# Patient Record
Sex: Female | Born: 1935 | Race: Asian | Hispanic: No | Smoking: Never smoker
Health system: Southern US, Community
[De-identification: ages and names within clinical notes are randomized; demographics above are authoritative.]

## PROBLEM LIST (undated history)

## (undated) DIAGNOSIS — I10 Essential (primary) hypertension: Secondary | ICD-10-CM

## (undated) DIAGNOSIS — M1990 Unspecified osteoarthritis, unspecified site: Secondary | ICD-10-CM

## (undated) DIAGNOSIS — I6529 Occlusion and stenosis of unspecified carotid artery: Secondary | ICD-10-CM

## (undated) DIAGNOSIS — Z8543 Personal history of malignant neoplasm of ovary: Secondary | ICD-10-CM

## (undated) DIAGNOSIS — E119 Type 2 diabetes mellitus without complications: Secondary | ICD-10-CM

## (undated) DIAGNOSIS — E785 Hyperlipidemia, unspecified: Secondary | ICD-10-CM

## (undated) HISTORY — PX: CATARACT EXTRACTION: SUR2

## (undated) HISTORY — DX: Type 2 diabetes mellitus without complications: E11.9

## (undated) HISTORY — PX: ABDOMINAL HYSTERECTOMY: SHX81

## (undated) HISTORY — PX: APPENDECTOMY: SHX54

## (undated) HISTORY — DX: Essential (primary) hypertension: I10

## (undated) HISTORY — DX: Unspecified osteoarthritis, unspecified site: M19.90

## (undated) HISTORY — DX: Hyperlipidemia, unspecified: E78.5

## (undated) HISTORY — DX: Personal history of malignant neoplasm of ovary: Z85.43

## (undated) HISTORY — DX: Occlusion and stenosis of unspecified carotid artery: I65.29

---

## 2013-07-16 DIAGNOSIS — M25561 Pain in right knee: Secondary | ICD-10-CM | POA: Insufficient documentation

## 2013-07-16 DIAGNOSIS — M25562 Pain in left knee: Secondary | ICD-10-CM | POA: Insufficient documentation

## 2013-07-16 DIAGNOSIS — M1710 Unilateral primary osteoarthritis, unspecified knee: Secondary | ICD-10-CM | POA: Insufficient documentation

## 2014-01-15 DIAGNOSIS — E559 Vitamin D deficiency, unspecified: Secondary | ICD-10-CM | POA: Diagnosis not present

## 2014-01-15 DIAGNOSIS — E785 Hyperlipidemia, unspecified: Secondary | ICD-10-CM | POA: Diagnosis not present

## 2014-01-15 DIAGNOSIS — M810 Age-related osteoporosis without current pathological fracture: Secondary | ICD-10-CM | POA: Diagnosis not present

## 2014-01-15 DIAGNOSIS — IMO0001 Reserved for inherently not codable concepts without codable children: Secondary | ICD-10-CM | POA: Diagnosis not present

## 2014-01-15 DIAGNOSIS — I10 Essential (primary) hypertension: Secondary | ICD-10-CM | POA: Diagnosis not present

## 2014-01-29 DIAGNOSIS — H26499 Other secondary cataract, unspecified eye: Secondary | ICD-10-CM | POA: Diagnosis not present

## 2015-07-02 ENCOUNTER — Ambulatory Visit (INDEPENDENT_AMBULATORY_CARE_PROVIDER_SITE_OTHER): Payer: Medicaid Other | Admitting: Family Medicine

## 2015-07-02 DIAGNOSIS — Z5321 Procedure and treatment not carried out due to patient leaving prior to being seen by health care provider: Secondary | ICD-10-CM

## 2015-07-02 NOTE — Progress Notes (Signed)
Patient was scheduled for an appointment today but there was no interperter. She was rescheduled for another day.

## 2015-07-02 NOTE — Patient Instructions (Signed)
Will reschedule for another day with an interpreter.

## 2015-07-06 ENCOUNTER — Ambulatory Visit (INDEPENDENT_AMBULATORY_CARE_PROVIDER_SITE_OTHER): Payer: Medicaid Other | Admitting: Family Medicine

## 2015-07-06 ENCOUNTER — Encounter: Payer: Self-pay | Admitting: Cardiovascular Disease

## 2015-07-06 ENCOUNTER — Encounter: Payer: Self-pay | Admitting: Family Medicine

## 2015-07-06 ENCOUNTER — Ambulatory Visit (INDEPENDENT_AMBULATORY_CARE_PROVIDER_SITE_OTHER): Payer: Medicare Other | Admitting: Cardiovascular Disease

## 2015-07-06 VITALS — BP 149/75 | HR 92 | Ht 59.0 in | Wt 132.0 lb

## 2015-07-06 VITALS — BP 120/62 | HR 92 | Ht 59.0 in | Wt 135.5 lb

## 2015-07-06 DIAGNOSIS — I10 Essential (primary) hypertension: Secondary | ICD-10-CM

## 2015-07-06 DIAGNOSIS — E1165 Type 2 diabetes mellitus with hyperglycemia: Secondary | ICD-10-CM

## 2015-07-06 DIAGNOSIS — N644 Mastodynia: Secondary | ICD-10-CM | POA: Diagnosis not present

## 2015-07-06 DIAGNOSIS — R079 Chest pain, unspecified: Secondary | ICD-10-CM

## 2015-07-06 DIAGNOSIS — R0789 Other chest pain: Secondary | ICD-10-CM

## 2015-07-06 DIAGNOSIS — R269 Unspecified abnormalities of gait and mobility: Secondary | ICD-10-CM | POA: Diagnosis not present

## 2015-07-06 DIAGNOSIS — R1012 Left upper quadrant pain: Secondary | ICD-10-CM

## 2015-07-06 DIAGNOSIS — Z780 Asymptomatic menopausal state: Secondary | ICD-10-CM

## 2015-07-06 DIAGNOSIS — L404 Guttate psoriasis: Secondary | ICD-10-CM | POA: Diagnosis not present

## 2015-07-06 DIAGNOSIS — Z5181 Encounter for therapeutic drug level monitoring: Secondary | ICD-10-CM | POA: Diagnosis not present

## 2015-07-06 DIAGNOSIS — R109 Unspecified abdominal pain: Secondary | ICD-10-CM

## 2015-07-06 DIAGNOSIS — M129 Arthropathy, unspecified: Secondary | ICD-10-CM

## 2015-07-06 DIAGNOSIS — G8929 Other chronic pain: Secondary | ICD-10-CM | POA: Diagnosis not present

## 2015-07-06 DIAGNOSIS — R10A2 Flank pain, left side: Secondary | ICD-10-CM

## 2015-07-06 DIAGNOSIS — E785 Hyperlipidemia, unspecified: Secondary | ICD-10-CM

## 2015-07-06 DIAGNOSIS — M170 Bilateral primary osteoarthritis of knee: Secondary | ICD-10-CM

## 2015-07-06 LAB — UA/M W/RFLX CULTURE, ROUTINE
BILIRUBIN UA: NEGATIVE
KETONES UA: NEGATIVE
LEUKOCYTES UA: NEGATIVE
NITRITE UA: NEGATIVE
Protein, UA: NEGATIVE
SPEC GRAV UA: 1.01 (ref 1.005–1.030)
Urobilinogen, Ur: 0.2 mg/dL (ref 0.2–1.0)
pH, UA: 7 (ref 5.0–7.5)

## 2015-07-06 LAB — MICROSCOPIC EXAMINATION

## 2015-07-06 MED ORDER — PANTOPRAZOLE SODIUM 40 MG PO TBEC
40.0000 mg | DELAYED_RELEASE_TABLET | Freq: Every day | ORAL | Status: DC
Start: 2015-07-06 — End: 2015-09-21

## 2015-07-06 MED ORDER — ASPIRIN EC 81 MG PO TBEC: 81.0000 mg | DELAYED_RELEASE_TABLET | Freq: Every day | ORAL | Status: DC

## 2015-07-06 NOTE — Assessment & Plan Note (Signed)
Check A1C and urine microalbumin; foot exam today by MD; continue meds; check creatinine to make sure the metformin is appropriate

## 2015-07-06 NOTE — Assessment & Plan Note (Signed)
Check urine today to r/o hematuria; stool cards given

## 2015-07-06 NOTE — Assessment & Plan Note (Signed)
Check fasting lipids and sgpt; limit saturated fats

## 2015-07-06 NOTE — Patient Instructions (Addendum)
I have entered in an order for a bone density study to check for osteoporosis I have entered a referral for you to see a heart doctor to check for blockages in your heart that might cause your left-sided chest pain; they will see you at 2:30 pm today If you develop significant pain in your chest, call 911 Do not do anything to exert yourself or cause stress until you get checked out by the heart doctor Start aspirin 81 mg daily, but make sure it is coated to protect your stomach; start that today If you develop abdominal pain, heartburn, acid reflux, or dark stools, then stop taking the aspirin and call the doctor Try turmeric as a natural anti-inflammatory (for pain and arthritis). It comes in capsules where you buy aspirin and fish oil, but also as a spice where you buy pepper and garlic powder. If you need something for aches or pains, try to use Tylenol (acetaminphen) instead of non-steroidals (which include Aleve, ibuprofen, Advil, Motrin, and naproxen); non-steroidals can cause long-term kidney damage Please return the stool cards at your convenience before your appointment, or you can bring them with you in two weeks Try to limit saturated fats (like bacon, sausage, hamburgers, fried foods, cheese) and try to eat no more than three eggs per week Try to eat plenty of fruits and vegetables Check your feet every night for sores, and let me know if any develop We recommend that you see an eye doctor every year for your diabetes Return in two weeks for follow-up and we'll make sure we have addressed all of your concerns Request your records from last doctor

## 2015-07-06 NOTE — Patient Instructions (Addendum)
Medication Instructions:  Your physician has recommended you make the following change in your medication:  START taking protonix 40mg  once per day   Labwork: none  Testing/Procedures: Your physician has requested that you have a lexiscan myoview. For further information please visit [email or web address] Please follow instruction sheet, as given.  [facility] MYOVIEW  Your caregiver has ordered a Stress Test with nuclear imaging. The purpose of this test is to evaluate the blood supply to your heart muscle. This procedure is referred to as a "Non-Invasive Stress Test." This is because other than having an IV started in your vein, nothing is inserted or "invades" your body. Cardiac stress tests are done to find areas of poor blood flow to the heart by determining the extent of coronary artery disease (CAD). Some patients exercise on a treadmill, which naturally increases the blood flow to your heart, while others who are  unable to walk on a treadmill due to physical limitations have a pharmacologic/chemical stress agent called Lexiscan . This medicine will mimic walking on a treadmill by temporarily increasing your coronary blood flow.   Please note: these test may take anywhere between 2-4 hours to complete  PLEASE REPORT TO [facility] MEDICAL MALL ENTRANCE  THE VOLUNTEERS AT THE FIRST DESK WILL DIRECT YOU WHERE TO GO  Date of Procedure: September 1, 8:00am Arrival Time for Procedure: 7:30am  Instructions regarding medication:   __xx__ : Hold metformin 24 hours before and 48 hours after procedure  ____:  Hold betablocker(s) night before procedure and morning of procedure  _xx___:  Hold other medications as follows:_losartan/HCTZ the morning of procedure  PLEASE NOTIFY THE OFFICE AT LEAST 24 HOURS IN ADVANCE IF YOU ARE UNABLE TO KEEP YOUR APPOINTMENT.  [phone] AND  PLEASE NOTIFY NUCLEAR MEDICINE AT [facility] AT LEAST 24 HOURS IN ADVANCE IF YOU ARE UNABLE TO KEEP YOUR APPOINTMENT.  [phone]  How to prepare for your Myoview test:   Do not eat or drink after midnight  No caffeine for 24 hours prior to test  No smoking 24 hours prior to test.  Your medication may be taken with water.  If your doctor stopped a medication because of this test, do not take that medication.  Ladies, please do not wear dresses.  Skirts or pants are appropriate. Please wear a short sleeve shirt.  No perfume, cologne or lotion.  Wear comfortable walking shoes. No heels!            Follow-Up: Your physician recommends that you schedule a follow-up appointment with Dr. Kirke Corin as needed.    Any Other Special Instructions Will Be Listed Below (If Applicable).  Nuclear Medicine Exam A nuclear medicine exam is a safe and painless imaging test. It helps to detect and diagnose disease in the body as well as provide information about organ function and structure.  Nuclear scans are most often done of the:  Lungs.  Heart.  Thyroid gland.  Bones.  Abdomen. HOW A NUCLEAR MEDICINE EXAM WORKS A nuclear medicine exam works by using a radioactive tracer. The material is given either by an IV (intravenous) injection or it may be swallowed. After the tracer is in the body, it is absorbed by your body's organs. A large scanning machine that uses a special camera detects the radioactivity in your body. A computerized image is then formed regarding the area of concern. The small amounts of radioactive material used in a nuclear medicine exam are found to be medically safe. However, because radioactive material is used, this  test is not done if you are pregnant or nursing.  BEFORE THE PROCEDURE  If available, bring previous imaging studies such as x-rays, etc. with you to the exam.  Arrive early for your exam. PROCEDURE  An IV may be started before the exam begins.  Depending on the type of examination, will lie on a table or sit in a chair during the exam.  The nuclear medicine  exam will take about 30 to 60 minutes to complete. AFTER THE PROCEDURE  After your scan is completed, the image(s) will be evaluated by a specialist. It is important that you follow up with your caregiver to find out your test results.  You may return to your regular activity as instructed by your caregiver. SEEK IMMEDIATE MEDICAL CARE IF: You have shortness of breath or difficulty breathing. MAKE SURE YOU:   Understand these instructions.  Will watch your condition.  Will get help right away if you are not doing well or get worse. Document Released: 11/30/2004 Document Revised: 01/15/2012 Document Reviewed: 01/14/2009 [facility] Patient Information 2015 [place], [place]. This information is not intended to replace advice given to you by your health care provider. Make sure you discuss any questions you have with your health care provider.

## 2015-07-06 NOTE — Assessment & Plan Note (Signed)
Fair control; continue current meds

## 2015-07-06 NOTE — Assessment & Plan Note (Signed)
Patient has single-prong cane; she does not want four-prong cane or walker; she would like scooter or motorized wheelchair; family member will contact a company of their choice and send paperwork, and we'll do face-to-face for that when paperwork comes in

## 2015-07-06 NOTE — Progress Notes (Signed)
Primary care physician: Dr. Sherie Don.  HPI  This is a pleasant 81 year old female who was referred for evaluation of chest pain and an abnormal EKG. She is originally from [place] and does not speak any [place]. An interpreter is present with her. 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 She has no previous cardiac history. She has multiple chronic medical conditions that include type 2 diabetes, hypertension and hyperlipidemia. She reports left-sided chest pain under the left breast radiating to her back. This usually gets worse with touch and certain movements. She describes another content of chest discomfort substernally that radiates to her throat. This gets worse with spicy food. She describes coughing spells when she lies down. She walks with a cane normally. She describes recent symptoms of exertional dyspnea. No orthopnea or PND. She is not a smoker and has no family history of coronary artery disease.   No Known Allergies   Current Outpatient Prescriptions on File Prior to Visit  Medication Sig Dispense Refill  . Empagliflozin-Linagliptin (GLYXAMBI) 25-5 MG TABS Take 1 tablet by mouth daily.    Marland Kitchen losartan-hydrochlorothiazide (HYZAAR) 100-25 MG per tablet Take 1 tablet by mouth daily.    . metFORMIN (GLUCOPHAGE-XR) 500 MG 24 hr tablet Take 500 mg by mouth 2 (two) times daily.    . pravastatin (PRAVACHOL) 20 MG tablet Take 20 mg by mouth at bedtime.     No current facility-administered medications on file prior to visit.     Past Medical History  Diagnosis Date  . Hyperlipidemia   . Hypertension   . Diabetes mellitus without complication   . History of ovarian cancer   . DJD (degenerative joint disease)      Past Surgical History  Procedure Laterality Date  . Appendectomy    . Abdominal hysterectomy      partial, due to ovarian cancer  . Cataract extraction       Family History  Problem Relation Age of Onset  . Family history unknown: Yes     Social History    Social History  . Marital Status: Unknown    Spouse Name: N/A  . Number of Children: N/A  . Years of Education: N/A   Occupational History  . Not on file.   Social History Main Topics  . Smoking status: Never Smoker   . Smokeless tobacco: Never Used  . Alcohol Use: No  . Drug Use: No  . Sexual Activity: Not on file   Other Topics Concern  . Not on file   Social History Narrative     ROS A 10 point review of system was performed. It is negative other than that mentioned in the history of present illness.   PHYSICAL EXAM   BP 120/62 mmHg  Pulse 92  Ht  (1.499 m)  Wt 135 lb 8 oz (61.462 kg)  BMI 27.35 kg/m2 Constitutional: She is oriented to person, place, and time. She appears well-developed and well-nourished. No distress.  HENT: No nasal discharge.  Head: Normocephalic and atraumatic.  Eyes: Pupils are equal and round. No discharge.  Neck: Normal range of motion. Neck supple. No JVD present. No thyromegaly present.  Cardiovascular: Normal rate, regular rhythm, normal heart sounds. Exam reveals no gallop and no friction rub. No murmur heard.  Pulmonary/Chest: Effort normal and breath sounds normal. No stridor. No respiratory distress. She has no wheezes. She has no rales. She exhibits no tenderness.  Abdominal: Soft. Bowel sounds are normal. She exhibits no distension. There is  no tenderness. There is no rebound and no guarding.  Musculoskeletal: Normal range of motion. She exhibits no edema and no tenderness.  Neurological: She is alert and oriented to person, place, and time. Coordination normal.  Skin: Skin is warm and dry. No rash noted. She is not diaphoretic. No erythema. No pallor.  Psychiatric: She has a normal mood and affect. Her behavior is normal. Judgment and thought content normal.     ZOX:[id number]  Rhythm  WITHIN NORMAL LIMITS    ASSESSMENT AND PLAN

## 2015-07-06 NOTE — Assessment & Plan Note (Signed)
Will try tylenol and turmeric; deferred xrays today since she has other pressing problems

## 2015-07-06 NOTE — Assessment & Plan Note (Signed)
The left sided chest pain under the left breast seems to be musculoskeletal in etiology. She also has other symptoms that are highly suggestive of GERD. Thus, I prescribed Protonix 40 mg once daily. She describes significant exertional dyspnea over the last few months and has prolonged history of diabetes and other risk factors. EKG from earlier today was abnormal. However, repeat in our office is normal. I recommend evaluation with a pharmacologic nuclear stress test.

## 2015-07-06 NOTE — Assessment & Plan Note (Signed)
Blood pressure is controlled on current medications. 

## 2015-07-06 NOTE — Progress Notes (Signed)
BP 149/75 mmHg  Pulse 92  Ht  (1.499 m)  Wt 132 lb (59.875 kg)  BMI 26.65 kg/m2  SpO2 96%   Subjective:    Patient ID: Tasha Rivera, female    DOB: [birth year], 81 y.o.   MRN: [id number]  HPI: Tasha Rivera is a 81 y.o. female  Chief Complaint  Patient presents with  . Establish Care   Patient does not speak any English; she is accompanied by the [facility] interpreter High blood pressure; on medicines; had high BP for about 10 years; she has had some cough; no leg swelling  Coughing up phlegm, less than a month; no fever; no shortness of breath; irritating over the left side of the chest from time to time, under the left breast; she had a lung problem when she was young; many years ago; worse with spicy or salty food; thinks maybe side effect; no fam hx of heart attack; the pain lasts about short time, then gone; lasts just a minute; rubs and it goes away; no nausea; no pain to the shoulder; actually comes from left flank; sharp; blood when wiping, but not in the bowel; no blood in the urine  Blackey on her head; about 6 years ago, fell on left side of head, not sure why she brought this up; does not bother her much or every day; lost some hearing, wearing hearing aide  Arthritis in her knees and hands  Psoriasis on arms and legs; has a cream for that; it helps the rash; they forgot to bring that today, don't know the name of it  Diabetes; has had diabetes about 20 years; check sugars, 260 this morning before breakfast; last blood draw was 5 months; dry mouth; no blurred vision; had eye surgery a while ago, cataract surgery and then eyes have been weak; nocturia 3-4 times; no problems with diabetes medicine  High cholesterol; last labs 5 months ago; diagnosed 20 years ago  Wants to make sure she doesn't have osteoporosis; would like a bone test  She is a Education officer, environmental  Relevant past medical, surgical, family and social history reviewed and updated as indicated. Interim medical  history since our last visit reviewed. Allergies and medications reviewed and updated.  Review of Systems Per HPI unless specifically indicated above     Objective:    BP 149/75 mmHg  Pulse 92  Ht  (1.499 m)  Wt 132 lb (59.875 kg)  BMI 26.65 kg/m2  SpO2 96%  Wt Readings from Last 3 Encounters:  07/06/15 132 lb (59.875 kg)    Physical Exam  Constitutional: She appears well-developed and well-nourished. No distress.  HENT:  Head: Normocephalic and atraumatic.  Eyes: EOM are normal. No scleral icterus.  Neck: No thyromegaly present.  Cardiovascular: Normal rate, regular rhythm and normal heart sounds.   No murmur heard. Pulmonary/Chest: Effort normal and breath sounds normal. No respiratory distress. She has no wheezes.  Abdominal: Soft. Bowel sounds are normal. She exhibits no distension. There is no tenderness. There is no guarding.  Musculoskeletal: Normal range of motion. She exhibits no edema.  Neurological: She is alert. She exhibits normal muscle tone.  Skin: Skin is warm and dry. Rash (erythematous guttate lesions on the extremities, mild erythema with whitish scale) noted. No bruising, no ecchymosis and no purpura noted. She is not diaphoretic. No cyanosis. No pallor. Nails show no clubbing.  Psychiatric: She has a normal mood and affect. Her behavior is normal. Judgment and thought content normal.  Very  pleasant, good eye contact with examiner   Diabetic Foot Form - Detailed   Diabetic Foot Exam - detailed  Diabetic Foot exam was performed with the following findings:  Yes 07/06/2015 12:15 PM  Visual Foot Exam completed.:  Yes  Are the toenails long?:  No  Are the toenails thick?:  No  Are the toenails ingrown?:  No    Pulse Foot Exam completed.:  Yes  Right Dorsalis Pedis:  Present Left Dorsalis Pedis:  Present  Sensory Foot Exam Completed.:  Yes  Semmes-Weinstein Monofilament Test  R Site 1-Great Toe:  Pos L Site 1-Great Toe:  Pos  R Site 4:  Pos L Site 4:   Pos  R Site 5:  Pos L Site 5:  Pos       No results found for this or any previous visit.    Assessment & Plan:   Problem List Items Addressed This Visit      Cardiovascular and Mediastinum   Essential hypertension, benign    Fair control; continue current meds      Relevant Medications   pravastatin (PRAVACHOL) 20 MG tablet   losartan-hydrochlorothiazide (HYZAAR) 100-25 MG per tablet   aspirin EC 81 MG tablet     Endocrine   Type 2 diabetes mellitus with hyperglycemia - Primary    Check A1C and urine microalbumin; foot exam today by MD; continue meds; check creatinine to make sure the metformin is appropriate      Relevant Medications   pravastatin (PRAVACHOL) 20 MG tablet   metFORMIN (GLUCOPHAGE-XR) 500 MG 24 hr tablet   losartan-hydrochlorothiazide (HYZAAR) 100-25 MG per tablet   Empagliflozin-Linagliptin (GLYXAMBI) 25-5 MG TABS   aspirin EC 81 MG tablet   Other Relevant Orders   Microalbumin / creatinine urine ratio   Hgb A1c w/o eAG     Musculoskeletal and Integument   Arthritis of both knees    Will try tylenol and turmeric; deferred xrays today since she has other pressing problems      Relevant Medications   aspirin EC 81 MG tablet   Psoriasis, guttate     Other   Pain of left breast   Dyslipidemia    Check fasting lipids and sgpt; limit saturated fats      Relevant Medications   pravastatin (PRAVACHOL) 20 MG tablet   Other Relevant Orders   Lipid Panel w/o Chol/HDL Ratio   Medication monitoring encounter   Relevant Orders   CBC with Differential/Platelet   Comprehensive metabolic panel   Chronic left flank pain    Check urine today to r/o hematuria; stool cards given      Relevant Orders   CBC with Differential/Platelet   UA/M w/rflx Culture, Routine   Post-menopausal   Relevant Orders   DG Bone Density   Other chest pain    With EKG changes; refer to cardiologist; she has multiple cardiac risk factors; start aspirin, see AVS       Relevant Orders   EKG 12-Lead (Completed)   Ambulatory referral to Cardiology   Gait abnormality    Patient has single-prong cane; she does not want four-prong cane or walker; she would like scooter or motorized wheelchair; family member will contact a company of their choice and send paperwork, and we'll do face-to-face for that when paperwork comes in         Follow up plan: Return in about 2 weeks (around 07/20/2015) for multiple issues, 45 minutes.  An after-visit summary was printed and given to  the patient at check-out.  Please see the patient instructions which may contain other information and recommendations beyond what is mentioned above in the assessment and plan.  Orders Placed This Encounter  Procedures  . DG Bone Density  . CBC with Differential/Platelet  . Lipid Panel w/o Chol/HDL Ratio  . Microalbumin / creatinine urine ratio  . Comprehensive metabolic panel  . UA/M w/rflx Culture, Routine  . Hgb A1c w/o eAG  . Ambulatory referral to Cardiology  . EKG 12-Lead   Meds ordered this encounter  Medications  . pravastatin (PRAVACHOL) 20 MG tablet    Sig: Take 20 mg by mouth at bedtime.  . metFORMIN (GLUCOPHAGE-XR) 500 MG 24 hr tablet    Sig: Take 500 mg by mouth 2 (two) times daily.  Marland Kitchen losartan-hydrochlorothiazide (HYZAAR) 100-25 MG per tablet    Sig: Take 1 tablet by mouth daily.  . Empagliflozin-Linagliptin (GLYXAMBI) 25-5 MG TABS    Sig: Take 1 tablet by mouth daily.  Marland Kitchen aspirin EC 81 MG tablet    Sig: Take 1 tablet (81 mg total) by mouth daily.    Face-to-face time with patient was more than 50 minutes, >50% time spent counseling and coordination of care

## 2015-07-06 NOTE — Assessment & Plan Note (Signed)
With EKG changes; refer to cardiologist; she has multiple cardiac risk factors; start aspirin, see AVS

## 2015-07-07 ENCOUNTER — Telehealth: Payer: Self-pay

## 2015-07-07 LAB — LIPID PANEL W/O CHOL/HDL RATIO
Cholesterol, Total: 162 mg/dL (ref 100–199)
HDL: 39 mg/dL — AB (ref >39)
LDL Calculated: 49 mg/dL (ref 0–99)
Triglycerides: 368 mg/dL — ABNORMAL HIGH (ref 0–149)
VLDL CHOLESTEROL CAL: 74 mg/dL — AB (ref 5–40)

## 2015-07-07 LAB — CBC WITH DIFFERENTIAL/PLATELET
BASOS: 0 %
Basophils Absolute: 0 10*3/uL (ref 0.0–0.2)
EOS (ABSOLUTE): 0.2 10*3/uL (ref 0.0–0.4)
EOS: 2 %
HEMATOCRIT: 39 % (ref 34.0–46.6)
Hemoglobin: 12.8 g/dL (ref 11.1–15.9)
Immature Grans (Abs): 0 10*3/uL (ref 0.0–0.1)
Immature Granulocytes: 0 %
LYMPHS ABS: 3.1 10*3/uL (ref 0.7–3.1)
Lymphs: 33 %
MCH: 29.3 pg (ref 26.6–33.0)
MCHC: 32.8 g/dL (ref 31.5–35.7)
MCV: 89 fL (ref 79–97)
MONOS ABS: 0.8 10*3/uL (ref 0.1–0.9)
Monocytes: 8 %
NEUTROS ABS: 5.3 10*3/uL (ref 1.4–7.0)
Neutrophils: 57 %
PLATELETS: 236 10*3/uL (ref 150–379)
RBC: 4.37 x10E6/uL (ref 3.77–5.28)
RDW: 12.5 % (ref 12.3–15.4)
WBC: 9.5 10*3/uL (ref 3.4–10.8)

## 2015-07-07 LAB — MICROALBUMIN / CREATININE URINE RATIO
CREATININE, UR: 14.3 mg/dL
MICROALB/CREAT RATIO: 96.5 mg/g{creat} — AB (ref 0.0–30.0)
MICROALBUM., U, RANDOM: 13.8 ug/mL

## 2015-07-07 LAB — COMPREHENSIVE METABOLIC PANEL
A/G RATIO: 1.3 (ref 1.1–2.5)
ALK PHOS: 78 IU/L (ref 39–117)
ALT: 32 IU/L (ref 0–32)
AST: 32 IU/L (ref 0–40)
Albumin: 4.3 g/dL (ref 3.5–4.7)
BUN/Creatinine Ratio: 37 — ABNORMAL HIGH (ref 11–26)
BUN: 31 mg/dL — ABNORMAL HIGH (ref 8–27)
Bilirubin Total: 0.3 mg/dL (ref 0.0–1.2)
CALCIUM: 10.3 mg/dL (ref 8.7–10.3)
CO2: 26 mmol/L (ref 18–29)
Chloride: 89 mmol/L — ABNORMAL LOW (ref 97–108)
Creatinine, Ser: 0.84 mg/dL (ref 0.57–1.00)
GFR calc Af Amer: 71 mL/min/{1.73_m2} (ref >59)
GFR, EST NON AFRICAN AMERICAN: 62 mL/min/{1.73_m2} (ref >59)
GLOBULIN, TOTAL: 3.2 g/dL (ref 1.5–4.5)
Glucose: 271 mg/dL — ABNORMAL HIGH (ref 65–99)
POTASSIUM: 4.7 mmol/L (ref 3.5–5.2)
SODIUM: 132 mmol/L — AB (ref 134–144)
Total Protein: 7.5 g/dL (ref 6.0–8.5)

## 2015-07-07 LAB — HGB A1C W/O EAG: HEMOGLOBIN A1C: 10.6 % — AB (ref 4.8–5.6)

## 2015-07-07 NOTE — Telephone Encounter (Signed)
Interpretor called to check on coverage for medicaid to find out what insurance covers.  Given number to billing department for [facility] and also asked to call medicaid sw .

## 2015-07-08 ENCOUNTER — Ambulatory Visit
Admission: RE | Admit: 2015-07-08 | Discharge: 2015-07-08 | Disposition: A | Discharge status: Home / Self Care | Payer: Medicare Other | Source: Ambulatory Visit | Attending: Cardiovascular Disease | Admitting: Cardiovascular Disease

## 2015-07-08 DIAGNOSIS — R079 Chest pain, unspecified: Secondary | ICD-10-CM | POA: Insufficient documentation

## 2015-07-08 LAB — NM MYOCAR MULTI W/SPECT W/WALL MOTION / EF
CHL CUP NUCLEAR SSS: 4
CSEPHR: 84 %
CSEPPHR: 111 {beats}/min
LV dias vol: 23 mL
LVSYSVOL: 7 mL
NUC STRESS TID: 1
Rest HR: 84 {beats}/min
SDS: 1
SRS: 3

## 2015-07-08 MED ORDER — REGADENOSON 0.4 MG/5ML IV SOLN
0.4000 mg | Freq: Once | INTRAVENOUS | Status: AC
  Administered 2015-07-08: 0.4 mg via INTRAVENOUS
  Filled 2015-07-08: qty 5

## 2015-07-08 MED ORDER — TECHNETIUM TC 99M SESTAMIBI GENERIC - CARDIOLITE
32.3000 | Freq: Once | INTRAVENOUS | Status: AC | PRN
  Administered 2015-07-08: 32.3 via INTRAVENOUS

## 2015-07-08 MED ORDER — TECHNETIUM TC 99M SESTAMIBI - CARDIOLITE
13.2940 | Freq: Once | INTRAVENOUS | Status: AC | PRN
  Administered 2015-07-08: 13.294 via INTRAVENOUS

## 2015-07-09 ENCOUNTER — Telehealth: Payer: Self-pay

## 2015-07-09 ENCOUNTER — Encounter: Payer: Self-pay | Admitting: Family Medicine

## 2015-07-09 MED ORDER — LOSARTAN POTASSIUM-HCTZ 100-12.5 MG PO TABS: 1.0000 | ORAL_TABLET | Freq: Every day | ORAL | Status: DC

## 2015-07-09 MED ORDER — METFORMIN HCL ER 500 MG PO TB24: 1000.0000 mg | ORAL_TABLET | Freq: Two times a day (BID) | ORAL | Status: DC

## 2015-07-09 NOTE — Telephone Encounter (Signed)
This is generic, has been for years; I sent another message to pharmacy to say generic, same type of med she was on, should be covered, can't see why not

## 2015-07-09 NOTE — Telephone Encounter (Signed)
Hyzaar requires a prior auth. Do you want Tasha Rivera to try for it, or change to something else?

## 2015-07-16 ENCOUNTER — Other Ambulatory Visit: Payer: Medicaid Other

## 2015-07-16 DIAGNOSIS — R109 Unspecified abdominal pain: Secondary | ICD-10-CM

## 2015-07-16 LAB — FECAL OCCULT BLOOD, GUAIAC
SPECIMEN 1: NEGATIVE
SPECIMEN 2: NEGATIVE
Specimen 3: NEGATIVE

## 2015-07-22 ENCOUNTER — Ambulatory Visit (INDEPENDENT_AMBULATORY_CARE_PROVIDER_SITE_OTHER): Payer: Medicaid Other | Admitting: Family Medicine

## 2015-07-22 ENCOUNTER — Encounter: Payer: Self-pay | Admitting: Family Medicine

## 2015-07-22 ENCOUNTER — Telehealth: Payer: Self-pay

## 2015-07-22 ENCOUNTER — Telehealth: Payer: Self-pay | Admitting: Family Medicine

## 2015-07-22 VITALS — BP 112/57 | HR 93 | Temp 98.3°F

## 2015-07-22 DIAGNOSIS — E1165 Type 2 diabetes mellitus with hyperglycemia: Secondary | ICD-10-CM

## 2015-07-22 DIAGNOSIS — E878 Other disorders of electrolyte and fluid balance, not elsewhere classified: Secondary | ICD-10-CM

## 2015-07-22 DIAGNOSIS — E781 Pure hyperglyceridemia: Secondary | ICD-10-CM

## 2015-07-22 DIAGNOSIS — I10 Essential (primary) hypertension: Secondary | ICD-10-CM | POA: Diagnosis not present

## 2015-07-22 DIAGNOSIS — R05 Cough: Secondary | ICD-10-CM | POA: Diagnosis not present

## 2015-07-22 DIAGNOSIS — R269 Unspecified abnormalities of gait and mobility: Secondary | ICD-10-CM

## 2015-07-22 DIAGNOSIS — M25512 Pain in left shoulder: Secondary | ICD-10-CM | POA: Diagnosis not present

## 2015-07-22 DIAGNOSIS — L404 Guttate psoriasis: Secondary | ICD-10-CM

## 2015-07-22 DIAGNOSIS — E871 Hypo-osmolality and hyponatremia: Secondary | ICD-10-CM | POA: Diagnosis not present

## 2015-07-22 DIAGNOSIS — M129 Arthropathy, unspecified: Secondary | ICD-10-CM | POA: Diagnosis not present

## 2015-07-22 DIAGNOSIS — M170 Bilateral primary osteoarthritis of knee: Secondary | ICD-10-CM

## 2015-07-22 DIAGNOSIS — R059 Cough, unspecified: Secondary | ICD-10-CM

## 2015-07-22 DIAGNOSIS — Z780 Asymptomatic menopausal state: Secondary | ICD-10-CM

## 2015-07-22 DIAGNOSIS — E785 Hyperlipidemia, unspecified: Secondary | ICD-10-CM | POA: Diagnosis not present

## 2015-07-22 MED ORDER — LOSARTAN POTASSIUM 100 MG PO TABS: 100.0000 mg | ORAL_TABLET | Freq: Every day | ORAL | Status: DC

## 2015-07-22 MED ORDER — EMPAGLIFLOZIN-LINAGLIPTIN 25-5 MG PO TABS
1.0000 | ORAL_TABLET | Freq: Every day | ORAL | Status: DC
Start: 2015-07-22 — End: 2015-08-12

## 2015-07-22 MED ORDER — METFORMIN HCL ER 500 MG PO TB24: 1000.0000 mg | ORAL_TABLET | Freq: Two times a day (BID) | ORAL | Status: DC

## 2015-07-22 MED ORDER — CLOBETASOL PROPIONATE 0.05 % EX OINT: 1.0000 "application " | TOPICAL_OINTMENT | Freq: Two times a day (BID) | CUTANEOUS | Status: DC

## 2015-07-22 NOTE — Patient Instructions (Addendum)
We'll refer you to a diabetes specialist for your high sugars We'll have you see the orthopaedist about your knees and shoulder Please have xrays done at [facility] (both knees and chest) We'll get a bone density test ([facility] staff, please f/u on the order from August 30th) Return to see me in 3 months, 45-60 minute visit with [place]-speaking interpreter Start fish oil 1000 or 1200 mg twice a day STOP the losartan/hctz 100/12.5 Just start PLAIN losartan 100 mg; I sent a new prescription already  Try [place] Nasal saline gel

## 2015-07-22 NOTE — Telephone Encounter (Signed)
S/w Dr. Sherie Don at [facility] who called to inquire of pt stress test results. Pt was in her office and did not know results. Reviewed Dr. Jari Sportsman notes w/Dr. Sherie Don who states she will communicate this to pt.

## 2015-07-22 NOTE — Assessment & Plan Note (Signed)
Daughter was instructed to call the company she wants to use for a mobility device (scooter or wheelchair) and company can fax [place] the paperwork

## 2015-07-22 NOTE — Assessment & Plan Note (Addendum)
High A1C (over 10); she does not want to start insulin; she is willing to be referred to endocrinologist to see what other options there are besides any sort of injectable

## 2015-07-22 NOTE — Assessment & Plan Note (Signed)
Topical corticosteroid refilled today; just use small amount

## 2015-07-22 NOTE — Assessment & Plan Note (Signed)
Stop HCTZ altogether

## 2015-07-22 NOTE — Assessment & Plan Note (Signed)
Awaiting DEXA scan; I notified my office staff to check on the order I placed for this on August 30th and instructed the patient and her daughter via the interpreter that someone should be contacting them to get this scheduled

## 2015-07-22 NOTE — Progress Notes (Signed)
BP 112/57 mmHg  Pulse 93  Temp(Src) 98.3 F (36.8 C)  SpO2 96%   Subjective:    Patient ID: Tasha Rivera, female    DOB: 07/31/1924, 81 y.o.   MRN: [id number]  HPI: Tasha Rivera is a 81 y.o. female  Chief Complaint  Patient presents with  . Results  . Medication Refill   She is here with her daughter and the Korean-speaking interpreter; the visit took much longer than would typically because of the language barrier and the fact that the patient is hard of hearing  Patient established care at the last visit and we addressed several things; at that visit, she had left shoulder pain and had an abnormal EKG; she was sent over to cardiology; she underwent a stress test; however, through the interpreter, it sounds like she does not know the results of the stress test; they thought the results would come to me; I personally called Dr. Jari Sportsman office and they got nurse on the phone; she says that a message was left on the patient's voicemail on Sept 1st, notifying her that the stress test was normal and to call with any questions; the patient did not know this and was relieved to hear those results relayed by me today  She has very poorly controlled type 2 diabetes; she had labs drawn at the last visit and her A1C was over 10; checks FSBS but she was not able to tell me how often; her blood sugar this morning was over 230; the patient does not want to take insulin, no shots; she is on metformin plus a combination pill (glyxambi)  She also has high cholesterol; she takes a statin drug; her last TG was 368, HDL was 39, and LDL was 49; she denies muscle aches, denies RUQ pain  Her left shoulder still hurts; hurts with abduction; short time duration, but she was not able to tell me if the shoulder has been hurting for days or weeks or months; patient "falls a lot" and her daughter says patient wants a scooter  She has terrible knee pain; both knees affected; it does not sound like she has had recent xrays,  but history is again difficult to obtain, with language barrier and patient being very hard of hearing; she has tried voltaren gel, but it's expensive and so she only uses it once a day  She has had a cough for one month; no fever; has drainage from her nose, some irritation in her nose  Needs ointment for psoriasis on skin; she says the spots appeared after she developed a lump in the left leg; many years ago; the lumps is apparently no bigger, then she says she has lumps in both legs and a few on her abdomen; these have apparently been there for a very long time without getting any bigger  Her labs showed that her Na+ and Cl- were low and her losartan/hctz was reduced; her BP today shows that is actually rather low  The patient wants to get a DEXA scan; this was ordered in August, but patient and daughter have not heard about the test date or time  Patient returned the stool cards; they were negative I told her today  Relevant past medical, surgical, family and social history reviewed and updated as indicated. Interim medical history since our last visit reviewed. Allergies and medications reviewed and updated.  Review of Systems  Per HPI unless specifically indicated above     Objective:    BP 112/57 mmHg  Pulse 93  Temp(Src) 98.3 F (36.8 C)  SpO2 96%  Wt Readings from Last 3 Encounters:  07/06/15 135 lb 8 oz (61.462 kg)  07/06/15 132 lb (59.875 kg)    Physical Exam  Constitutional: She appears well-developed and well-nourished. No distress.  HENT:  Head: Normocephalic and atraumatic.  Right Ear: Decreased hearing is noted.  Left Ear: Ear canal normal. Decreased hearing is noted.  Nose: No mucosal edema, rhinorrhea, nasal deformity or septal deviation. No epistaxis.  Mouth/Throat: Oropharynx is clear and moist. Mucous membranes are not dry.  Cerumen in the right external auditory canal; no nasal turbinate hypertrophy  Eyes: EOM are normal. No scleral icterus.  Neck: No  thyromegaly present.  Cardiovascular: Normal rate, regular rhythm and normal heart sounds.   No murmur heard. Pulmonary/Chest: Effort normal and breath sounds normal. No respiratory distress. She has no wheezes.  Abdominal: Soft. Bowel sounds are normal. She exhibits no distension. There is no tenderness. There is no guarding.  Musculoskeletal: She exhibits no edema.       Left shoulder: She exhibits decreased range of motion. She exhibits no bony tenderness and no swelling.       Right knee: She exhibits decreased range of motion, swelling and deformity. She exhibits no effusion and no erythema.       Left knee: She exhibits decreased range of motion, swelling and deformity. She exhibits no effusion and no erythema.  Crepitus of both knees with active flexion and extension  Neurological: She is alert. She exhibits normal muscle tone.  Skin: Skin is warm and dry. Rash (erythematous guttate lesions on the extremities, mild erythema with whitish scale) noted. No bruising, no ecchymosis and no purpura noted. She is not diaphoretic. No cyanosis. No pallor. Nails show no clubbing.  In the subcutaneous tissue, there are a few soft fatty soft tissue masses which are mobile, no overlying skin changes; on the lateral left thigh, right thigh, abdomen  Psychiatric: She has a normal mood and affect. Her behavior is normal. Judgment and thought content normal.  Very pleasant, good eye contact with examiner   Diabetic Foot Form - Detailed   Diabetic Foot Exam - detailed  Diabetic Foot exam was performed with the following findings:  Yes 07/22/2015  4:06 PM  Visual Foot Exam completed.:  Yes  Is there a history of foot ulcer?:  No  Are the shoes appropriate in style and fit?:  Yes  Are the toenails long?:  No  Are the toenails thick?:  No  Is there a claw toe deformity?:  No  Are the toenails ingrown?:  No    Pulse Foot Exam completed.:  Yes  Right Dorsalis Pedis:  Present Left Dorsalis Pedis:  Present   Sensory Foot Exam Completed.:  Yes  Semmes-Weinstein Monofilament Test  R Site 1-Great Toe:  Pos L Site 1-Great Toe:  Pos  R Site 4:  Pos L Site 4:  Neg  R Site 5:  Pos L Site 5:  Neg        Results for orders placed or performed in visit on 07/16/15  [facility] Stool Card-TAKE HOME  Result Value Ref Range   Specimen 1 neg    Specimen 2 neg    Specimen 3 neg       Assessment & Plan:   Problem List Items Addressed This Visit      Cardiovascular and Mediastinum   Essential hypertension, benign    BP actually low; with her low Na+ and  low Cl-, will stop the HCTZ component completely; just use plain losartan      Relevant Medications   losartan (COZAAR) 100 MG tablet     Endocrine   Type 2 diabetes mellitus with hyperglycemia    High A1C (over 10); she does not want to start insulin; she is willing to be referred to endocrinologist to see what other options there are besides any sort of injectable      Relevant Medications   metFORMIN (GLUCOPHAGE-XR) 500 MG 24 hr tablet   Empagliflozin-Linagliptin (GLYXAMBI) 25-5 MG TABS   losartan (COZAAR) 100 MG tablet   Other Relevant Orders   Ambulatory referral to Endocrinology     Musculoskeletal and Integument   Arthritis of both knees - Primary    With significant pain and affecting her mobility; may use voltaren gel, refilled; offered other pain medicine but she politely declined; I will get xrays and refer to orthopaedist for consideration of other treatment options (steroid injection, synvisc, etc.)      Relevant Orders   DG Knee Complete 4 Views Right   Ambulatory referral to Orthopedic Surgery   Psoriasis, guttate    Topical corticosteroid refilled today; just use small amount        Other   Dyslipidemia    LDL at goal on the statin; TG likely up in part due to high A1C; start fish oil      Post-menopausal    Awaiting DEXA scan; I notified my office staff to check on the order I placed for this on August 30th and  instructed the patient and her daughter via the interpreter that someone should be contacting them to get this scheduled      Gait abnormality    Daughter was instructed to call the company she wants to use for a mobility device (scooter or wheelchair) and company can fax [place] the paperwork      Shoulder pain, left    With limited ROM; patient saw cardiologist who reported negative stress test; will refer to orthopaedist; I will defer xrays to him/her in case special views are desired      Relevant Orders   Ambulatory referral to Orthopedic Surgery   Cough    Likely related to some postnasal drip, but with her left shoulder pain, will get CXR to r/o mass in the thoracic cavity      Relevant Orders   DG Chest 2 View   Hyponatremia    Stop HCTZ altogether      Hypertriglyceridemia    Start fish oil; TG likely also elevated because of the very high A1C      Relevant Medications   losartan (COZAAR) 100 MG tablet   Hypochloremia    Stop HCTZ altogether         Follow up plan: Return in about 3 months (around 10/21/2015) for multiple things.  More than an hour and fifteen minutes were spent with patient, daughter, and Korean-speaking interpreter face-to-face for office visit; >50% of time spent counseling and coordination of care  An after-visit summary was printed and given to the patient at check-out.  Please see the patient instructions which may contain other information and recommendations beyond what is mentioned above in the assessment and plan.  Meds ordered this encounter  Medications  . DISCONTD: clobetasol ointment (TEMOVATE) 0.05 %    Sig: Apply 1 application topically 2 (two) times daily.  . diclofenac sodium (VOLTAREN) 1 % GEL    Sig: Apply 2 g topically  4 (four) times daily.  . clobetasol ointment (TEMOVATE) 0.05 %    Sig: Apply 1 application topically 2 (two) times daily.    Dispense:  30 g    Refill:  3  . metFORMIN (GLUCOPHAGE-XR) 500 MG 24 hr tablet     Sig: Take 2 tablets (1,000 mg total) by mouth 2 (two) times daily.    Dispense:  360 tablet    Refill:  1    New instructions  . Empagliflozin-Linagliptin (GLYXAMBI) 25-5 MG TABS    Sig: Take 1 tablet by mouth daily.    Dispense:  90 tablet    Refill:  1  . losartan (COZAAR) 100 MG tablet    Sig: Take 1 tablet (100 mg total) by mouth daily. Stop the one with hctz    Dispense:  90 tablet    Refill:  0   I also personally called patient's pharmacy to let them know I am stopping the losartan/hctz so there would not be an inadvertent fill

## 2015-07-22 NOTE — Assessment & Plan Note (Signed)
Stop HCTZ altogether 

## 2015-07-22 NOTE — Assessment & Plan Note (Addendum)
BP actually low; with her low Na+ and low Cl-, will stop the HCTZ component completely; just use plain losartan

## 2015-07-22 NOTE — Assessment & Plan Note (Addendum)
With significant pain and affecting her mobility; may use voltaren gel, refilled; offered other pain medicine but she politely declined; I will get xrays and refer to orthopaedist for consideration of other treatment options (steroid injection, synvisc, etc.)

## 2015-07-22 NOTE — Assessment & Plan Note (Signed)
Likely related to some postnasal drip, but with her left shoulder pain, will get CXR to r/o mass in the thoracic cavity

## 2015-07-22 NOTE — Telephone Encounter (Signed)
done

## 2015-07-22 NOTE — Assessment & Plan Note (Signed)
Start fish oil; TG likely also elevated because of the very high A1C

## 2015-07-22 NOTE — Assessment & Plan Note (Signed)
With limited ROM; patient saw cardiologist who reported negative stress test; will refer to orthopaedist; I will defer xrays to him/her in case special views are desired

## 2015-07-22 NOTE — Assessment & Plan Note (Signed)
LDL at goal on the statin; TG likely up in part due to high A1C; start fish oil

## 2015-07-22 NOTE — Telephone Encounter (Signed)
Please call pharmacy; tell them we are STOPPING the losartan/HCTZ Cancel any old refills, give patient teaching if needed Just using plain losartan now

## 2015-08-04 ENCOUNTER — Telehealth: Payer: Self-pay | Admitting: Family Medicine

## 2015-08-04 NOTE — Telephone Encounter (Signed)
I spoke with pharmacy to clarify what was needed. A prior auth is needed on Losartan and Glyxambi. She has Medicaid. Id # [id number] M.

## 2015-08-04 NOTE — Telephone Encounter (Signed)
Pt came in and her daughter says that [facility] said she needed to come here and pick up a hard copy of her rx for diabetes medicine

## 2015-08-11 ENCOUNTER — Ambulatory Visit
Admission: RE | Admit: 2015-08-11 | Discharge: 2015-08-11 | Disposition: A | Discharge status: Home / Self Care | Payer: Medicare Other | Source: Ambulatory Visit | Attending: Family Medicine | Admitting: Family Medicine

## 2015-08-11 ENCOUNTER — Telehealth: Payer: Self-pay | Admitting: Family Medicine

## 2015-08-11 DIAGNOSIS — M810 Age-related osteoporosis without current pathological fracture: Secondary | ICD-10-CM | POA: Diagnosis not present

## 2015-08-11 DIAGNOSIS — J984 Other disorders of lung: Secondary | ICD-10-CM | POA: Insufficient documentation

## 2015-08-11 DIAGNOSIS — M179 Osteoarthritis of knee, unspecified: Secondary | ICD-10-CM | POA: Diagnosis not present

## 2015-08-11 DIAGNOSIS — M129 Arthropathy, unspecified: Secondary | ICD-10-CM | POA: Diagnosis present

## 2015-08-11 DIAGNOSIS — M25461 Effusion, right knee: Secondary | ICD-10-CM | POA: Diagnosis not present

## 2015-08-11 DIAGNOSIS — R05 Cough: Secondary | ICD-10-CM | POA: Insufficient documentation

## 2015-08-11 DIAGNOSIS — Z780 Asymptomatic menopausal state: Secondary | ICD-10-CM | POA: Diagnosis present

## 2015-08-11 NOTE — Telephone Encounter (Signed)
Pending medical director approval as of 08/11/15, pharmacy and ML notified

## 2015-08-12 ENCOUNTER — Other Ambulatory Visit: Payer: Self-pay | Admitting: Family Medicine

## 2015-08-12 ENCOUNTER — Encounter: Payer: Self-pay | Admitting: Family Medicine

## 2015-08-12 DIAGNOSIS — J40 Bronchitis, not specified as acute or chronic: Secondary | ICD-10-CM

## 2015-08-12 MED ORDER — ALENDRONATE SODIUM 70 MG PO TABS
70.0000 mg | ORAL_TABLET | ORAL | Status: DC
Start: 2015-08-12 — End: 2016-02-16

## 2015-08-12 MED ORDER — DOXYCYCLINE HYCLATE 100 MG PO TABS
100.0000 mg | ORAL_TABLET | Freq: Two times a day (BID) | ORAL | Status: DC
Start: 2015-08-12 — End: 2015-09-21

## 2015-08-12 NOTE — Progress Notes (Signed)
Bronchitis possibly on CXR; start doxy; recheck PA and lat CXR in 3-4 weeks

## 2015-08-12 NOTE — Progress Notes (Signed)
Patient notified

## 2015-08-13 ENCOUNTER — Ambulatory Visit: Payer: Self-pay | Admitting: Family Medicine

## 2015-08-14 ENCOUNTER — Other Ambulatory Visit: Payer: Self-pay | Admitting: Family Medicine

## 2015-08-14 MED ORDER — METFORMIN HCL ER 500 MG PO TB24: 1000.0000 mg | ORAL_TABLET | Freq: Two times a day (BID) | ORAL | Status: DC

## 2015-08-14 MED ORDER — LINAGLIPTIN 5 MG PO TABS: 5.0000 mg | ORAL_TABLET | Freq: Every day | ORAL | Status: DC

## 2015-08-14 NOTE — Telephone Encounter (Signed)
I spoke with pharmacist; insurance will not pay for Glyxambi; will stop that and Rx tradjenta Reviewed her electrolytes; will not replace the SGLT-2 inhibitor Last EF >65%; consider Actos, but will hold on that for now since I have already referred her to endocrinologist

## 2015-08-23 ENCOUNTER — Other Ambulatory Visit: Payer: Self-pay | Admitting: Family Medicine

## 2015-08-24 ENCOUNTER — Other Ambulatory Visit: Payer: Self-pay | Admitting: Family Medicine

## 2015-08-25 NOTE — Telephone Encounter (Signed)
Routing to provider  

## 2015-08-26 DIAGNOSIS — E1165 Type 2 diabetes mellitus with hyperglycemia: Secondary | ICD-10-CM | POA: Diagnosis not present

## 2015-08-26 DIAGNOSIS — E781 Pure hyperglyceridemia: Secondary | ICD-10-CM | POA: Diagnosis not present

## 2015-08-26 DIAGNOSIS — E118 Type 2 diabetes mellitus with unspecified complications: Secondary | ICD-10-CM | POA: Diagnosis not present

## 2015-08-26 DIAGNOSIS — M25562 Pain in left knee: Secondary | ICD-10-CM | POA: Diagnosis not present

## 2015-09-09 ENCOUNTER — Ambulatory Visit (INDEPENDENT_AMBULATORY_CARE_PROVIDER_SITE_OTHER): Payer: Medicaid Other | Admitting: Family Medicine

## 2015-09-10 DIAGNOSIS — M47812 Spondylosis without myelopathy or radiculopathy, cervical region: Secondary | ICD-10-CM | POA: Diagnosis not present

## 2015-09-10 DIAGNOSIS — M542 Cervicalgia: Secondary | ICD-10-CM | POA: Diagnosis not present

## 2015-09-10 DIAGNOSIS — M25511 Pain in right shoulder: Secondary | ICD-10-CM | POA: Diagnosis not present

## 2015-09-10 DIAGNOSIS — M129 Arthropathy, unspecified: Secondary | ICD-10-CM | POA: Diagnosis not present

## 2015-09-21 ENCOUNTER — Ambulatory Visit (INDEPENDENT_AMBULATORY_CARE_PROVIDER_SITE_OTHER): Payer: Medicare Other | Admitting: Family Medicine

## 2015-09-21 ENCOUNTER — Encounter: Payer: Self-pay | Admitting: Family Medicine

## 2015-09-21 VITALS — BP 147/76 | HR 87 | Temp 98.5°F | Ht 59.0 in | Wt 142.6 lb

## 2015-09-21 DIAGNOSIS — M25512 Pain in left shoulder: Secondary | ICD-10-CM

## 2015-09-21 DIAGNOSIS — R269 Unspecified abnormalities of gait and mobility: Secondary | ICD-10-CM | POA: Diagnosis not present

## 2015-09-21 DIAGNOSIS — M810 Age-related osteoporosis without current pathological fracture: Secondary | ICD-10-CM

## 2015-09-21 DIAGNOSIS — M129 Arthropathy, unspecified: Secondary | ICD-10-CM

## 2015-09-21 DIAGNOSIS — M170 Bilateral primary osteoarthritis of knee: Secondary | ICD-10-CM

## 2015-09-21 MED ORDER — PANTOPRAZOLE SODIUM 40 MG PO TBEC: 40.0000 mg | DELAYED_RELEASE_TABLET | Freq: Every day | ORAL | Status: DC

## 2015-09-21 MED ORDER — DICLOFENAC SODIUM 1 % TD GEL: 2.0000 g | Freq: Four times a day (QID) | TRANSDERMAL | Status: DC

## 2015-09-21 MED ORDER — CLOBETASOL PROPIONATE 0.05 % EX OINT: 1.0000 "application " | TOPICAL_OINTMENT | Freq: Two times a day (BID) | CUTANEOUS | Status: DC

## 2015-09-21 NOTE — Patient Instructions (Addendum)
Since your pain has increased, I would like to ask your orthopaedist to please evaluate the right tibial plateau and consider getting an xray of the right tibia and whether or not re-imaging the right knee is worthwhile; that would be in addition to the left knee xrays that they are going to do; I am concerned about a hairline fracture  We'll have the [employer] people contact you once I finish the paperwork; if you have not heard from them in one week, please call my assistant AMY  Start back on the pantoprazole; this will protect your stomach  Start the new meloxicam for pain, just once a day; take with food  Do NOT take any other non-steroidals (Aleve, ibuprofen, Advil, Motrin, naproxen)  It IS OKAY to take Tylenol (acetaminophen)  If you develop abdominal pain, dark stools, blood in your stools, worsening heartburn, etc please stop the meloxicam and alendronate and seek help right away (go to the emergency department)

## 2015-09-21 NOTE — Progress Notes (Signed)
BP 147/76 mmHg  Pulse 87  Temp(Src) 98.5 F (36.9 C)  Ht _0  (1.499 m)  Wt 142 lb 9.6 oz (64.683 kg)  BMI 28.79 kg/m2  SpO2 96%   Subjective:    Patient ID: Tasha Rivera, female    DOB: April 08, 1924, 81 y.o.   MRN: [id number]  HPI: Anaid Haney is a 81 y.o. female  Chief Complaint  Patient presents with  . Follow-up    For [employer]  [place]orean-speaking interpreter and daughter (who speaks just a little [place]) both here with patient today  Patient is here for mobility device evaluation; see forms; she has arthritis in both knees and pain in her left shoulder, along with being elderly (81 years old) She saw Dr. Mikle Bosworth two weeks ago (orthopaedist) She had xrays on the right knee in October and that showed moderate to severe OA She has left knee xrays tomorrow She also has pain in the left shoulder She had shoulder xrays on the left side and ortho is treating her for that They will refer to a physical therapist  Questions for form: In her own words, what problem is causing difficulty with mobility: Arthritis in both knees; severe Trouble moving from room to room: YES Trouble with toileting: YES, sometimes crawling; trouble getting up off of the toilte Difficulty with dressing: YES, trouble standing, trouble with ROM of left arm/shoulder Feeding: a little bit of trouble but not bad, right-handed Preparing meals: CANNOT AT ALL; cannot stand for any length of time to prepare meal Grooming: YES Bathing: YES; very hard to stand up in the shower; cannot get in the bathtub Stairs in the home, five steps in daughter's home; prayer institution more steps; she spends a good deal of time at the prayer institution, so she will be using the mobility device in two separate locations primarily All one floor for daily living Using cane right now in the office; she is not able to use walker because of the left shoulder pain Why can't a cane or walker meet her needs? Poor balance; no falls in the  last month; also the pain is too bad to change positions Cannot use manual wheelchair because of upper body strength, weakness, left shoulder pain Cannot use scooter because of home environment, they don't think they have room to maneuver one there She is motivated to use the mobility device and will use it  Pain when walking is about a 8 out of 10 Left shoulder pain is about a 5 out of 10 She took a pain pill and that helps; name was meloxicam, 15 mg pill  Relevant past medical, surgical, family and social history reviewed and updated as indicated. Interim medical history since our last visit reviewed. Allergies and medications reviewed and updated.  She also has spots on the skin of psoriasis; needs cream for that  She is taking alendronate once a week for her bones; reviewed DEXA with her  Review of Systems Per HPI unless specifically indicated above     Objective:    BP 147/76 mmHg  Pulse 87  Temp(Src) 98.5 F (36.9 C)  Ht _1  (1.499 m)  Wt 142 lb 9.6 oz (64.683 kg)  BMI 28.79 kg/m2  SpO2 96%  Wt Readings from Last 3 Encounters:  09/21/15 142 lb 9.6 oz (64.683 kg)  07/06/15 135 lb 8 oz (61.462 kg)  07/06/15 132 lb (59.875 kg)   walking pulse ox did not drop below 96% on room air  Physical Exam  Constitutional:  She appears well-developed and well-nourished.  Eyes: EOM are normal.  Cardiovascular: Normal rate and regular rhythm.   Pulmonary/Chest: Effort normal and breath sounds normal.  Abdominal: She exhibits no distension.  Musculoskeletal: She exhibits no edema.       Left shoulder: She exhibits decreased range of motion and tenderness. She exhibits no swelling and no deformity.       Right knee: She exhibits decreased range of motion (very limited with flexion), swelling and deformity. Tenderness found. Medial joint line, lateral joint line and MCL tenderness noted.       Left knee: She exhibits decreased range of motion (very limited flexion), swelling,  effusion and deformity. Tenderness found. Lateral joint line and patellar tendon tenderness noted.  Neurological: She is alert. Gait abnormal.  Slow, deliberate gait, in obvious discomfort when talking; had to stop about every 20 feet using cane in our hallway  Skin: Skin is warm. Rash (guttate erythematous rash on both lower legs) noted. No bruising and no ecchymosis noted. No pallor.  Psychiatric: She has a normal mood and affect. Her speech is normal and behavior is normal.  Very pleasant, good eye contact with examiner   Diabetic Foot Form - Detailed   Diabetic Foot Exam - detailed  Diabetic Foot exam was performed with the following findings:  Yes 09/21/2015  1:48 PM  Visual Foot Exam completed.:  Yes  Is there a history of foot ulcer?:  No  Are the shoes appropriate in style and fit?:  Yes  Are the toenails long?:  No  Are the toenails thick?:  No  Are the toenails ingrown?:  No    Pulse Foot Exam completed.:  Yes  Right Dorsalis Pedis:  Present Left Dorsalis Pedis:  Present  Sensory Foot Exam Completed.:  Yes  Swelling:  No  Semmes-Weinstein Monofilament Test  R Site 1-Great Toe:  Pos L Site 1-Great Toe:  Pos  R Site 4:  Pos L Site 4:  Pos  R Site 5:  Pos L Site 5:  Pos       Results for orders placed or performed in visit on 07/16/15  [facility] Stool Card-TAKE HOME  Result Value Ref Range   Specimen 1 neg    Specimen 2 neg    Specimen 3 neg       Assessment & Plan:   Problem List Items Addressed This Visit      Musculoskeletal and Integument   Arthritis of both knees    Severe osteoarthritis of knees; I am also concerned with her increased pain in the tibial plateau on the right, she may have a fracture; see AVS; she will talk to the orthopaedist when she goes for xrays tomorrow to see about re-imaging that area; will get Rx for mobility device; start back on meloxicam for pain control, which has helped her in the past; start PPI; cautions given about risk of GI  bleeding, reasons to go to the emergency department; she had no problems with taking NSAID and the alendronate short-term without the PPI, so we'll add the PPI long-term to help decrease risk of GI bleeding; reasons to stop and seek care were again reviewed      Osteoporosis    Reviewed DEXA scan done Aug 11, 2015 with patient; calcium intake; fall precautions; she is at high risk for falling and subsequent fracture with her current gait which is affected by pain from her OA; tolerating alendronate plus NSAID, will add PPI for stomach protection; reasons to  STOP meds and seek medical help right away reviewed        Other   Gait abnormality - Primary    Will get mobility chair for her, as she is at high risk for falls and has crippling arthritis limiting her ability to walk      Shoulder pain, left    Limits her ability to get around using only a walker, and a cane does not provide enough stability with both knees being so severely affected by OA         Follow up plan: No Follow-up on file.  Daughter will get shower chair  An after-visit summary was printed and given to the patient at [place].  Please see the patient instructions which may contain other information and recommendations beyond what is mentioned above in the assessment and plan.  Meds ordered this encounter  Medications  . Blood Glucose Monitoring Suppl (FIFTY50 GLUCOSE METER 2.0) W/DEVICE KIT    Sig: Use as directed.  Marland Kitchen glucose blood test strip    Sig:   . pantoprazole (PROTONIX) 40 MG tablet    Sig: Take 1 tablet (40 mg total) by mouth daily.    Dispense:  30 tablet    Refill:  11  . clobetasol ointment (TEMOVATE) 0.05 %    Sig: Apply 1 application topically 2 (two) times daily.    Dispense:  30 g    Refill:  3  . diclofenac sodium (VOLTAREN) 1 % GEL    Sig: Apply 2 g topically 4 (four) times daily.    Dispense:  100 g    Refill:  2   The visit took longer than normal, as we had to run all of the  questions for the forms through the interpreter since patient does not speak any English; I also did gait evaluation and observed her during ambulation while she wore the pulse ox; face-to-face time with patient was more than 40 minutes, >50% time spent counseling and coordination of care

## 2015-09-22 DIAGNOSIS — M170 Bilateral primary osteoarthritis of knee: Secondary | ICD-10-CM | POA: Insufficient documentation

## 2015-09-26 DIAGNOSIS — M810 Age-related osteoporosis without current pathological fracture: Secondary | ICD-10-CM | POA: Insufficient documentation

## 2015-09-26 NOTE — Assessment & Plan Note (Signed)
Reviewed DEXA scan done Aug 11, 2015 with patient; calcium intake; fall precautions; she is at high risk for falling and subsequent fracture with her current gait which is affected by pain from her OA; tolerating alendronate plus NSAID, will add PPI for stomach protection; reasons to STOP meds and seek medical help right away reviewed

## 2015-09-26 NOTE — Assessment & Plan Note (Signed)
Will get mobility chair for her, as she is at high risk for falls and has crippling arthritis limiting her ability to walk

## 2015-09-26 NOTE — Assessment & Plan Note (Signed)
Limits her ability to get around using only a walker, and a cane does not provide enough stability with both knees being so severely affected by OA

## 2015-09-26 NOTE — Assessment & Plan Note (Addendum)
Severe osteoarthritis of knees; I am also concerned with her increased pain in the tibial plateau on the right, she may have a fracture; see AVS; she will talk to the orthopaedist when she goes for xrays tomorrow to see about re-imaging that area; will get Rx for mobility device; start back on meloxicam for pain control, which has helped her in the past; start PPI; cautions given about risk of GI bleeding, reasons to go to the emergency department; she had no problems with taking NSAID and the alendronate short-term without the PPI, so we'll add the PPI long-term to help decrease risk of GI bleeding; reasons to stop and seek care were again reviewed

## 2015-09-29 DIAGNOSIS — M170 Bilateral primary osteoarthritis of knee: Secondary | ICD-10-CM | POA: Diagnosis not present

## 2015-10-08 DIAGNOSIS — M170 Bilateral primary osteoarthritis of knee: Secondary | ICD-10-CM | POA: Diagnosis not present

## 2015-10-12 DIAGNOSIS — E118 Type 2 diabetes mellitus with unspecified complications: Secondary | ICD-10-CM | POA: Diagnosis not present

## 2015-10-12 DIAGNOSIS — E1165 Type 2 diabetes mellitus with hyperglycemia: Secondary | ICD-10-CM | POA: Diagnosis not present

## 2015-10-12 DIAGNOSIS — E781 Pure hyperglyceridemia: Secondary | ICD-10-CM | POA: Diagnosis not present

## 2015-10-12 NOTE — Progress Notes (Signed)
Done

## 2015-10-12 NOTE — Progress Notes (Signed)
done

## 2015-10-20 DIAGNOSIS — I10 Essential (primary) hypertension: Secondary | ICD-10-CM | POA: Diagnosis not present

## 2015-10-20 DIAGNOSIS — E1165 Type 2 diabetes mellitus with hyperglycemia: Secondary | ICD-10-CM | POA: Diagnosis not present

## 2015-10-20 DIAGNOSIS — E781 Pure hyperglyceridemia: Secondary | ICD-10-CM | POA: Diagnosis not present

## 2015-10-20 DIAGNOSIS — E118 Type 2 diabetes mellitus with unspecified complications: Secondary | ICD-10-CM | POA: Diagnosis not present

## 2015-10-21 ENCOUNTER — Encounter: Payer: Self-pay | Admitting: Family Medicine

## 2015-10-21 ENCOUNTER — Ambulatory Visit (INDEPENDENT_AMBULATORY_CARE_PROVIDER_SITE_OTHER): Payer: Medicare Other | Admitting: Family Medicine

## 2015-10-21 VITALS — BP 153/74 | HR 81 | Wt 145.0 lb

## 2015-10-21 DIAGNOSIS — M129 Arthropathy, unspecified: Secondary | ICD-10-CM | POA: Diagnosis not present

## 2015-10-21 DIAGNOSIS — G8929 Other chronic pain: Secondary | ICD-10-CM

## 2015-10-21 DIAGNOSIS — R1012 Left upper quadrant pain: Secondary | ICD-10-CM | POA: Diagnosis not present

## 2015-10-21 DIAGNOSIS — R102 Pelvic and perineal pain: Secondary | ICD-10-CM | POA: Diagnosis not present

## 2015-10-21 DIAGNOSIS — I10 Essential (primary) hypertension: Secondary | ICD-10-CM

## 2015-10-21 DIAGNOSIS — M170 Bilateral primary osteoarthritis of knee: Secondary | ICD-10-CM

## 2015-10-21 DIAGNOSIS — M545 Low back pain, unspecified: Secondary | ICD-10-CM

## 2015-10-21 DIAGNOSIS — L404 Guttate psoriasis: Secondary | ICD-10-CM

## 2015-10-21 DIAGNOSIS — E1165 Type 2 diabetes mellitus with hyperglycemia: Secondary | ICD-10-CM | POA: Diagnosis not present

## 2015-10-21 DIAGNOSIS — R109 Unspecified abdominal pain: Secondary | ICD-10-CM

## 2015-10-21 MED ORDER — CLOBETASOL PROPIONATE 0.05 % EX OINT: 1.0000 "application " | TOPICAL_OINTMENT | Freq: Two times a day (BID) | CUTANEOUS | Status: DC

## 2015-10-21 NOTE — Progress Notes (Signed)
BP 153/74 mmHg  Pulse 81  Wt 145 lb (65.772 kg)  SpO2 97%   Subjective:    Patient ID: Tasha Rivera, female    DOB: [birth year], 81 y.o.   MRN: [id number]  HPI: Tasha Rivera is a 81 y.o. female  Chief Complaint  Patient presents with  . Diabetes    3 month follow up  . Hyperlipidemia    3 month follow up  . Hypertension    3 month follow up  . Medication Refill    she'd like a larger tube of the clobetasol ointment    She had the Korean-speaking interpreter with her today  She has pain in the hip; she had the knee injection and she told him about the hip and back; she might be using the  She has pain in the lower back and pelvis; hurting 2-3 weeks She has not used any ice; uses hot floor and that helps  She has high blood pressure, but her pressure yesterday was good, 126 systolic on top  She has diabetes and saw the diabetic doctor yesterday; A1C 7.4 and glucose 127, creatinine 0.8  She has high cholesterol; TG came down almost 200 points  Cough is getting better; maybe the blood pressure medicine  Relevant past medical, surgical, family and social history reviewed and updated as indicated. Interim medical history since our last visit reviewed. Allergies and medications reviewed and updated.  Review of Systems  Constitutional: Negative for unexpected weight change.  Gastrointestinal: Positive for abdominal pain (left side). Negative for nausea, vomiting, diarrhea, constipation and blood in stool.  Genitourinary: Negative for hematuria.  Musculoskeletal: Positive for back pain.  Per HPI unless specifically indicated above     Objective:    BP 153/74 mmHg  Pulse 81  Wt 145 lb (65.772 kg)  SpO2 97%  Wt Readings from Last 3 Encounters:  10/21/15 145 lb (65.772 kg)  09/21/15 142 lb 9.6 oz (64.683 kg)  07/06/15 135 lb 8 oz (61.462 kg)    Physical Exam  Constitutional: She appears well-developed and well-nourished. No distress.  HENT:  Mouth/Throat: Mucous membranes  are normal.  Eyes: No scleral icterus.  Neck: No JVD present.  Cardiovascular: Normal rate and regular rhythm.   Pulmonary/Chest: Effort normal and breath sounds normal.  Abdominal: Soft. She exhibits no distension.  Musculoskeletal:       Right knee: She exhibits decreased range of motion, swelling and deformity.       Left knee: She exhibits decreased range of motion, swelling and deformity.       Lumbar back: She exhibits tenderness, bony tenderness and pain. She exhibits no edema and no deformity.  Neurological: She displays no tremor. Gait abnormal.  Skin: Rash noted. Rash is papular. No cyanosis. No pallor.  Psychiatric: She has a normal mood and affect.   Diabetic Foot Form - Detailed   Diabetic Foot Exam - detailed  Diabetic Foot exam was performed with the following findings:  Yes 10/21/2015  9:25 PM  Visual Foot Exam completed.:  Yes  Are the toenails long?:  No  Are the toenails thick?:  No  Are the toenails ingrown?:  No    Pulse Foot Exam completed.:  Yes  Right Dorsalis Pedis:  Present Left Dorsalis Pedis:  Present  Sensory Foot Exam Completed.:  Yes  Swelling:  No  Semmes-Weinstein Monofilament Test  R Site 1-Great Toe:  Pos L Site 1-Great Toe:  Pos  R Site 4:  Pos L Site 4:  Pos  R Site 5:  Pos L Site 5:  Pos       Results for orders placed or performed in visit on 07/16/15  [facility] Stool Card-TAKE HOME  Result Value Ref Range   Specimen 1 neg    Specimen 2 neg    Specimen 3 neg       Assessment & Plan:   Problem List Items Addressed This Visit      Cardiovascular and Mediastinum   Essential hypertension, benign    Cough seems to be resolving with change in BP medicine; not quite to goal, but I believe her pain is affecting her pressure; DASH guidelines encouraged        Musculoskeletal and Integument   Arthritis of both knees    Significant OA; seeing ortho; limiting mobility; hopeful that she will qualify for mobility wheelchair/scooter soon       Psoriasis, guttate    New Rx for the larger size of clobetasol; too strong for face        Other   Type 2 diabetes mellitus with hyperglycemia (HCC)    Foot exam by MD; seeing endocrinologist now as well; glad to see better control of her sugars      Relevant Medications   glipiZIDE (GLUCOTROL) 10 MG tablet   Chronic left flank pain    She has had negative stool cards x 3, negative urine; pain may be referred from back; offered additional imaging such as CT scan; she opted to start with plain xrays since she has so much in the way of arthritis; she is welcome to call and we will proceed with CT scanning if symptoms worsen or she changes her mind; we briefly discussed the difference between [place] [facility] where they can get xrays for anything they want at any time without a doctor's orders versus our system in which tests are ordered, approved, scheduled, etc.; I don't want our system to dissuade her from getting imaging though if she is having discomfort and it's not MS in nature; I think we understood each other, but this is where some culture issue may be playing a part      Low back pain - Primary    Will get lumbar spine xrays, patient to see orthopaedist      Relevant Orders   DG Lumbar Spine Complete   DG Pelvis 1-2 Views   Pelvic pain in female    Don't know if this was the correct code, but she has what sounds to be discomfort of the pelvic bones/sacrum; xrays ordered      Relevant Orders   DG Pelvis 1-2 Views      Follow up plan: Return in about 3 weeks (around 11/11/2015) for 45 minute appointment.  An after-visit summary was printed and given to the patient at check-out.  Please see the patient instructions which may contain other information and recommendations beyond what is mentioned above in the assessment and plan.  Meds ordered this encounter  Medications  . glipiZIDE (GLUCOTROL) 10 MG tablet    Sig: Take 10 mg by mouth 2 (two) times daily.  . clobetasol  ointment (TEMOVATE) 0.05 %    Sig: Apply 1 application topically 2 (two) times daily.    Dispense:  60 g    Refill:  3    Cancel the refills for the 30 gram size   Orders Placed This Encounter  Procedures  . DG Lumbar Spine Complete  . DG Pelvis 1-2 Views  Face-to-face time with patient was more than 40 minutes, >50% time spent counseling and coordination of care; having Korean-speaking interpreter and the language and cultural barrier significantly added to our time spent together today

## 2015-10-21 NOTE — Patient Instructions (Addendum)
Please check with a local [employer] group or school or church or charity to see if they might be able to help you with a wheelchair ramp The cost of the wheelchair ramp is not covered by Medicare to my understanding Please have the xray done of your back If your abdominal/side pain gets worse or you decide that you would like xrays, please let me know and we'll be glad to order those (abdominal and pelvic CT) Your goal blood pressure is less than 140 mmHg on top. Try to follow the DASH guidelines (DASH stands for Dietary Approaches to Stop Hypertension) Try to limit the sodium in your diet.  Ideally, consume less than 1.5 grams (less than 1,500mg ) per day. Do not add salt when cooking or at the table.  Check the sodium amount on labels when shopping, and choose items lower in sodium when given a choice. Avoid or limit foods that already contain a lot of sodium. Eat a diet rich in fruits and vegetables and whole grains. Check blood pressure at home and call if staying over 140 on top Keep up the great job with your diabetes  [name] ?? ?????

## 2015-10-25 ENCOUNTER — Telehealth: Payer: Self-pay | Admitting: Family Medicine

## 2015-10-25 DIAGNOSIS — M545 Low back pain, unspecified: Secondary | ICD-10-CM

## 2015-10-25 NOTE — Assessment & Plan Note (Signed)
Refer to ortho, get films

## 2015-10-25 NOTE — Telephone Encounter (Signed)
I was told patient needed new referral for new problem; referral entered

## 2015-11-06 NOTE — Assessment & Plan Note (Signed)
New Rx for the larger size of clobetasol; too strong for face

## 2015-11-06 NOTE — Assessment & Plan Note (Signed)
Foot exam by MD; seeing endocrinologist now as well; glad to see better control of her sugars

## 2015-11-06 NOTE — Assessment & Plan Note (Signed)
Cough seems to be resolving with change in BP medicine; not quite to goal, but I believe her pain is affecting her pressure; DASH guidelines encouraged

## 2015-11-06 NOTE — Assessment & Plan Note (Signed)
She has had negative stool cards x 3, negative urine; pain may be referred from back; offered additional imaging such as CT scan; she opted to start with plain xrays since she has so much in the way of arthritis; she is welcome to call and we will proceed with CT scanning if symptoms worsen or she changes her mind; we briefly discussed the difference between [place] [facility] where they can get xrays for anything they want at any time without a doctor's orders versus our system in which tests are ordered, approved, scheduled, etc.; I don't want our system to dissuade her from getting imaging though if she is having discomfort and it's not MS in nature; I think we understood each other, but this is where some culture issue may be playing a part

## 2015-11-06 NOTE — Assessment & Plan Note (Signed)
Don't know if this was the correct code, but she has what sounds to be discomfort of the pelvic bones/sacrum; xrays ordered

## 2015-11-06 NOTE — Assessment & Plan Note (Signed)
Will get lumbar spine xrays, patient to see orthopaedist

## 2015-11-06 NOTE — Assessment & Plan Note (Signed)
Significant OA; seeing ortho; limiting mobility; hopeful that she will qualify for mobility wheelchair/scooter soon

## 2015-11-07 DIAGNOSIS — I6529 Occlusion and stenosis of unspecified carotid artery: Secondary | ICD-10-CM

## 2015-11-07 HISTORY — DX: Occlusion and stenosis of unspecified carotid artery: I65.29

## 2015-11-10 ENCOUNTER — Ambulatory Visit: Payer: Self-pay | Admitting: Family Medicine

## 2015-11-22 ENCOUNTER — Ambulatory Visit
Admission: RE | Admit: 2015-11-22 | Discharge: 2015-11-22 | Disposition: A | Discharge status: Home / Self Care | Payer: Medicare Other | Source: Ambulatory Visit | Attending: Family Medicine | Admitting: Family Medicine

## 2015-11-22 ENCOUNTER — Encounter: Payer: Self-pay | Admitting: Family Medicine

## 2015-11-22 ENCOUNTER — Ambulatory Visit (INDEPENDENT_AMBULATORY_CARE_PROVIDER_SITE_OTHER): Payer: Medicare Other | Admitting: Family Medicine

## 2015-11-22 VITALS — BP 170/82 | HR 93 | Wt 144.0 lb

## 2015-11-22 DIAGNOSIS — R269 Unspecified abnormalities of gait and mobility: Secondary | ICD-10-CM | POA: Diagnosis not present

## 2015-11-22 DIAGNOSIS — M545 Low back pain, unspecified: Secondary | ICD-10-CM

## 2015-11-22 DIAGNOSIS — M24152 Other articular cartilage disorders, left hip: Secondary | ICD-10-CM | POA: Insufficient documentation

## 2015-11-22 DIAGNOSIS — H538 Other visual disturbances: Secondary | ICD-10-CM | POA: Diagnosis not present

## 2015-11-22 DIAGNOSIS — M24151 Other articular cartilage disorders, right hip: Secondary | ICD-10-CM | POA: Diagnosis not present

## 2015-11-22 DIAGNOSIS — R102 Pelvic and perineal pain: Secondary | ICD-10-CM

## 2015-11-22 DIAGNOSIS — J40 Bronchitis, not specified as acute or chronic: Secondary | ICD-10-CM

## 2015-11-22 DIAGNOSIS — M47816 Spondylosis without myelopathy or radiculopathy, lumbar region: Secondary | ICD-10-CM | POA: Diagnosis not present

## 2015-11-22 DIAGNOSIS — R140 Abdominal distension (gaseous): Secondary | ICD-10-CM | POA: Insufficient documentation

## 2015-11-22 DIAGNOSIS — M5136 Other intervertebral disc degeneration, lumbar region: Secondary | ICD-10-CM | POA: Diagnosis not present

## 2015-11-22 DIAGNOSIS — M160 Bilateral primary osteoarthritis of hip: Secondary | ICD-10-CM

## 2015-11-22 DIAGNOSIS — M549 Dorsalgia, unspecified: Secondary | ICD-10-CM | POA: Diagnosis not present

## 2015-11-22 MED ORDER — TRAMADOL HCL 50 MG PO TABS: 50.0000 mg | ORAL_TABLET | Freq: Four times a day (QID) | ORAL | Status: DC | PRN

## 2015-11-22 NOTE — Progress Notes (Signed)
BP 170/82 mmHg  Pulse 93  Wt 144 lb (65.318 kg)  SpO2 97%   Subjective:    Patient ID: Tasha Rivera, female    DOB: 03/14/1924, 82 y.o.   MRN: [id number]  HPI: Tasha Rivera is a 82 y.o. female  Chief Complaint  Patient presents with  . Hypertension    She brought in her BP readings.  . Abdominal Pain    Still hurting in her stomach. When she bends, she feels like something is coming up in her stomach. Still having bloating and discomfort.  . Knee Pain    left knee, arthritis   She had appt with orthopaedist on January 4th but they cancelled it She has not had the xrays done yet She has pain in the lower back and goes across the lower part, as well as in her hip; she saw orthopaedist already in November and December Her abdomen is bloated Moving downward (flexing at the waist) No blood in the urine No blood in the stool Weight has been stable  She got her wheelchair and it helps She has significant arthritis in her knees, and has already had two Synvisc injections  She had cataracts in both eyes; never had surgery, just gave her eye drops Like a smoke or film in her eyes;   Seeing endocrinologist for her diabetes Most recent FSBS: 166, 155, 151, 177  Relevant past medical, surgical, family and social history reviewed and updated as indicated. Interim medical history since our last visit reviewed. Allergies and medications reviewed and updated.  Review of Systems Per HPI unless specifically indicated above     Objective:     BP 170/82, pulse 93, weight 144 pounds, SpO2 97%  Physical Exam  Constitutional: She appears well-developed and well-nourished. No distress.  Cardiovascular: Normal rate and regular rhythm.   Pulmonary/Chest: Effort normal and breath sounds normal.  Abdominal: Soft. She exhibits no distension (but full) and no mass. There is no guarding.  Musculoskeletal: She exhibits no edema.       Right knee: She exhibits decreased range of motion and  deformity.       Left knee: She exhibits decreased range of motion and deformity.       Lumbar back: She exhibits decreased range of motion and tenderness.  Neurological: She is alert.  Skin: Skin is warm. Rash (psoriatic lesions on extremities) noted. She is not diaphoretic. No pallor.  Psychiatric: She has a normal mood and affect.   Results for orders placed or performed in visit on 07/16/15  [facility] Stool Card-TAKE HOME  Result Value Ref Range   Specimen 1 neg    Specimen 2 neg    Specimen 3 neg       Assessment & Plan:   Problem List Items Addressed This Visit      Musculoskeletal and Integument   Degenerative disc disease, lumbar    Patient to get the previously ordered xrays and then f/u with orthopaedics      Relevant Medications   traMADol (ULTRAM) 50 MG tablet   Degenerative joint disease (DJD) of hip    Patient to f/u with orthopaedics      Relevant Medications   traMADol (ULTRAM) 50 MG tablet     Other   Gait abnormality    With back pain, hip pain, knee pain, significant arthritis; so glad that she has received her morotized wheelchair (though she does not have it today)      Low back pain - Primary  Check lumbar films; CT scan abd and pelvis      Relevant Medications   traMADol (ULTRAM) 50 MG tablet   Other Relevant Orders   CT Abdomen Pelvis W Contrast (Completed)   DG Lumbar Spine Complete (Completed)   DG Abd 1 View (Completed)   Pelvic pain in female    Order CT scan; history has been difficult to get for this particular patient, even with interpreter's assistance      Relevant Orders   CT Abdomen Pelvis W Contrast (Completed)   DG Abd 1 View (Completed)   Blurred vision, bilateral    Patient to see eye specialist      Relevant Orders   Ambulatory referral to Ophthalmology   Abdominal bloating    Scans ordered      Relevant Orders   CT Abdomen Pelvis W Contrast (Completed)   DG Abd 1 View (Completed)      Follow up plan: Return  in about 1 week (around 11/29/2015) for with interpreter. To go over scan results  An after-visit summary was printed and given to the patient at check-out.  Please see the patient instructions which may contain other information and recommendations beyond what is mentioned above in the assessment and plan.

## 2015-11-22 NOTE — Patient Instructions (Addendum)
[facility] from here [facility] the [facility] Try the new pain medicine to see if it helps your back and knees Go to the ER if your pain gets very bad, or if you lose feeling in your legs or control of your bowel or bladder  God bless you ?? ??

## 2015-11-22 NOTE — Assessment & Plan Note (Signed)
Check lumbar films; CT scan abd and pelvis

## 2015-11-22 NOTE — Assessment & Plan Note (Addendum)
Order CT scan; history has been difficult to get for this particular patient, even with interpreter's assistance

## 2015-11-29 ENCOUNTER — Encounter: Payer: Self-pay | Admitting: Family Medicine

## 2015-11-29 ENCOUNTER — Ambulatory Visit (INDEPENDENT_AMBULATORY_CARE_PROVIDER_SITE_OTHER): Payer: Medicare Other | Admitting: Family Medicine

## 2015-11-29 VITALS — BP 170/79 | HR 96 | Wt 143.0 lb

## 2015-11-29 DIAGNOSIS — G8929 Other chronic pain: Secondary | ICD-10-CM

## 2015-11-29 DIAGNOSIS — R05 Cough: Secondary | ICD-10-CM

## 2015-11-29 DIAGNOSIS — R102 Pelvic and perineal pain: Secondary | ICD-10-CM | POA: Diagnosis not present

## 2015-11-29 DIAGNOSIS — I70299 Other atherosclerosis of native arteries of extremities, unspecified extremity: Secondary | ICD-10-CM

## 2015-11-29 DIAGNOSIS — I10 Essential (primary) hypertension: Secondary | ICD-10-CM

## 2015-11-29 DIAGNOSIS — R140 Abdominal distension (gaseous): Secondary | ICD-10-CM

## 2015-11-29 DIAGNOSIS — M160 Bilateral primary osteoarthritis of hip: Secondary | ICD-10-CM | POA: Diagnosis not present

## 2015-11-29 DIAGNOSIS — M5136 Other intervertebral disc degeneration, lumbar region: Secondary | ICD-10-CM

## 2015-11-29 DIAGNOSIS — M169 Osteoarthritis of hip, unspecified: Secondary | ICD-10-CM | POA: Insufficient documentation

## 2015-11-29 DIAGNOSIS — E785 Hyperlipidemia, unspecified: Secondary | ICD-10-CM | POA: Diagnosis not present

## 2015-11-29 DIAGNOSIS — M545 Low back pain, unspecified: Secondary | ICD-10-CM

## 2015-11-29 DIAGNOSIS — I700 Atherosclerosis of aorta: Secondary | ICD-10-CM

## 2015-11-29 DIAGNOSIS — R269 Unspecified abnormalities of gait and mobility: Secondary | ICD-10-CM

## 2015-11-29 DIAGNOSIS — R109 Unspecified abdominal pain: Secondary | ICD-10-CM

## 2015-11-29 DIAGNOSIS — R1012 Left upper quadrant pain: Secondary | ICD-10-CM

## 2015-11-29 DIAGNOSIS — I708 Atherosclerosis of other arteries: Secondary | ICD-10-CM

## 2015-11-29 DIAGNOSIS — R059 Cough, unspecified: Secondary | ICD-10-CM

## 2015-11-29 LAB — UA/M W/RFLX CULTURE, ROUTINE
BILIRUBIN UA: NEGATIVE
Glucose, UA: NEGATIVE
KETONES UA: NEGATIVE
LEUKOCYTES UA: NEGATIVE
Nitrite, UA: NEGATIVE
PROTEIN UA: NEGATIVE
RBC UA: NEGATIVE
SPEC GRAV UA: 1.01 (ref 1.005–1.030)
Urobilinogen, Ur: 0.2 mg/dL (ref 0.2–1.0)
pH, UA: 7 (ref 5.0–7.5)

## 2015-11-29 LAB — CBC WITH DIFFERENTIAL/PLATELET
Hematocrit: 34 % (ref 34.0–46.6)
Hemoglobin: 11.9 g/dL (ref 11.1–15.9)
LYMPHS: 39 %
Lymphocytes Absolute: 5 10*3/uL — ABNORMAL HIGH (ref 0.7–3.1)
MCH: 31.2 pg (ref 26.6–33.0)
MCHC: 35 g/dL (ref 31.5–35.7)
MCV: 89 fL (ref 79–97)
MID (Absolute): 1.4 10*3/uL (ref 0.1–1.6)
MID: 11 %
NEUTROS ABS: 6.4 10*3/uL (ref 1.4–7.0)
Neutrophils: 50 %
PLATELETS: 247 10*3/uL (ref 150–379)
RBC: 3.81 x10E6/uL (ref 3.77–5.28)
RDW: 12.4 % (ref 12.3–15.4)
WBC: 12.8 10*3/uL — ABNORMAL HIGH (ref 3.4–10.8)

## 2015-11-29 NOTE — Assessment & Plan Note (Addendum)
Check urine today; CT scan of abdomen and pelvis to look for mass; discussed with patient and daughter that work-up will include CT scan and labs; translation difficulties; uterus is gone, one ovary may remain, not sure; refer to gyn or gen surg if ovarian cancer or colon cancer or other condition; stool cards were negative for blood; close f/u later this week to go over scan results

## 2015-11-29 NOTE — Patient Instructions (Addendum)
Please have the CT scan done tomorrow as well as a chest xray We'll go over all of your results together If you develop significant pain or fever, go to the emergency department Stay well-hydrated Try to limit saturated fats in your diet (bologna, hot dogs, barbeque, cheeseburgers, hamburgers, steak, bacon, sausage, cheese, etc.) and get more fresh fruits, vegetables, and whole grains  DASH Eating Plan DASH stands for "Dietary Approaches to Stop Hypertension." The DASH eating plan is a healthy eating plan that has been shown to reduce high blood pressure (hypertension). Additional health benefits may include reducing the risk of type 2 diabetes mellitus, heart disease, and stroke. The DASH eating plan may also help with weight loss. WHAT DO I NEED TO KNOW ABOUT THE DASH EATING PLAN? For the DASH eating plan, you will follow these general guidelines:  Choose foods with a percent daily value for sodium of less than 5% (as listed on the food label).  Use salt-free seasonings or herbs instead of table salt or sea salt.  Check with your health care provider or pharmacist before using salt substitutes.  Eat lower-sodium products, often labeled as "lower sodium" or "no salt added."  Eat fresh foods.  Eat more vegetables, fruits, and low-fat dairy products.  Choose whole grains. Look for the word "whole" as the first word in the ingredient list.  Choose fish and skinless chicken or [place] more often than red meat. Limit fish, poultry, and meat to 6 oz (170 g) each day.  Limit sweets, desserts, sugars, and sugary drinks.  Choose heart-healthy fats.  Limit cheese to 1 oz (28 g) per day.  Eat more home-cooked food and less restaurant, buffet, and fast food.  Limit fried foods.  Cook foods using methods other than frying.  Limit canned vegetables. If you do use them, rinse them well to decrease the sodium.  When eating at a restaurant, ask that your food be prepared with less salt, or no  salt if possible. WHAT FOODS CAN I EAT? Seek help from a dietitian for individual calorie needs. Grains Whole grain or whole wheat bread. Brown rice. Whole grain or whole wheat pasta. Quinoa, bulgur, and whole grain cereals. Low-sodium cereals. Corn or whole wheat flour tortillas. Whole grain cornbread. Whole grain crackers. Low-sodium crackers. Vegetables Fresh or frozen vegetables (raw, steamed, roasted, or grilled). Low-sodium or reduced-sodium tomato and vegetable juices. Low-sodium or reduced-sodium tomato sauce and paste. Low-sodium or reduced-sodium canned vegetables.  Fruits All fresh, canned (in natural juice), or frozen fruits. Meat and Other Protein Products Ground beef (85% or leaner), grass-fed beef, or beef trimmed of fat. Skinless chicken or [place]. Ground chicken or [place]. Pork trimmed of fat. All fish and seafood. Eggs. Dried beans, peas, or lentils. Unsalted nuts and seeds. Unsalted canned beans. Dairy Low-fat dairy products, such as skim or 1% milk, 2% or reduced-fat cheeses, low-fat ricotta or cottage cheese, or plain low-fat yogurt. Low-sodium or reduced-sodium cheeses. Fats and Oils Tub margarines without trans fats. Light or reduced-fat mayonnaise and salad dressings (reduced sodium). Avocado. Safflower, olive, or canola oils. Natural peanut or almond butter. Other Unsalted popcorn and pretzels. The items listed above may not be a complete list of recommended foods or beverages. Contact your dietitian for more options. WHAT FOODS ARE NOT RECOMMENDED? Grains White bread. White pasta. White rice. Refined cornbread. Bagels and croissants. Crackers that contain trans fat. Vegetables Creamed or fried vegetables. Vegetables in a cheese sauce. Regular canned vegetables. Regular canned tomato sauce and paste. Regular  tomato and vegetable juices. Fruits Dried fruits. Canned fruit in light or heavy syrup. Fruit juice. Meat and Other Protein Products Fatty cuts of meat. Ribs,  chicken wings, bacon, sausage, bologna, salami, chitterlings, fatback, hot dogs, bratwurst, and packaged luncheon meats. Salted nuts and seeds. Canned beans with salt. Dairy Whole or 2% milk, cream, half-and-half, and cream cheese. Whole-fat or sweetened yogurt. Full-fat cheeses or blue cheese. Nondairy creamers and whipped toppings. Processed cheese, cheese spreads, or cheese curds. Condiments Onion and garlic salt, seasoned salt, table salt, and sea salt. Canned and packaged gravies. Worcestershire sauce. Tartar sauce. Barbecue sauce. Teriyaki sauce. Soy sauce, including reduced sodium. Steak sauce. Fish sauce. Oyster sauce. Cocktail sauce. Horseradish. Ketchup and mustard. Meat flavorings and tenderizers. Bouillon cubes. Hot sauce. Tabasco sauce. Marinades. Taco seasonings. Relishes. Fats and Oils Butter, stick margarine, lard, shortening, ghee, and bacon fat. Coconut, palm kernel, or palm oils. Regular salad dressings. Other Pickles and olives. Salted popcorn and pretzels. The items listed above may not be a complete list of foods and beverages to avoid. Contact your dietitian for more information. WHERE CAN I FIND MORE INFORMATION? National Heart, Lung, and Blood Institute: [employer]   This information is not intended to replace advice given to you by your health care provider. Make sure you discuss any questions you have with your health care provider.   Document Released: 10/12/2011 Document Revised: 11/13/2014 Document Reviewed: 08/27/2013 Elsevier Interactive Patient Education [employer]

## 2015-11-29 NOTE — Assessment & Plan Note (Addendum)
She has had asthma for a long time was what I understood at one point, but then not diagnosed with asthma (?); translation difficulties, not sure if actually asthma; will get chest xray; we did not have time today to do spirometry; will get at follow-up

## 2015-11-29 NOTE — Assessment & Plan Note (Addendum)
xrays have been done, awaiting CT scan of abdomen and pelvis; stool negative for blood; urine checked today, negative for blood

## 2015-11-29 NOTE — Progress Notes (Signed)
BP 170/79 mmHg  Pulse 96  Wt 143 lb (64.864 kg)  SpO2 95%   Subjective:    Patient ID: Tasha Rivera, female    DOB: 03-04-1924, 82 y.o.   MRN: [id number]  HPI: Tasha Rivera is a 82 y.o. female  Chief Complaint  Patient presents with  . Results    Patient here to discuss xray results from last week.   Patient is here with daughter and Korean-speaking interpreter Patient goes to see ortho on Friday She has back pain and right flank pain Has not had the CT scan yet; this was ordered last week; this appt was actually scheduled to go over those results No blood in stool Son had stomach cancer at age 29 She is not losing weight, feels like she is gaining  No blood in the stools Appetite is good She feels bloated One ovary remains, she had a hysterectomy for cysts She feels bloated like something on the left side and she pushes down Some constipation if she has not had enough water, but in general not a problem She has a cough, productive with a little bit of phlegm; cough has been going on for two months; she has asthma, but has not been diagnosed with asthma; question of translation Whenever she is hungry she starts to cough; whenever she is tried or cold she starts to cough Feels tired, fatigue Bringing up mucous; just clear; no blood; just clear like snow No fevers Feels like sweating; started a few weeks ago  Lumbar/pelvic xrays CLINICAL DATA: Back pain. Sciatica.  EXAM: PELVIS - 1-2 VIEW  COMPARISON: 11/22/2015.  FINDINGS: Degenerative changes lumbar spine and both hips. No acute bony or joint abnormality. Aortoiliac atherosclerotic vascular calcification.  IMPRESSION: No acute abnormality. Degenerative changes lumbar spine and both hips.  Relevant past medical, surgical, family and social history reviewed and updated as indicated. Interim medical history since our last visit reviewed. Allergies and medications reviewed and updated.  Review of Systems Per HPI  unless specifically indicated above     Objective:    BP 170/79 mmHg  Pulse 96  Wt 143 lb (64.864 kg)  SpO2 95%  Wt Readings from Last 3 Encounters:  11/29/15 143 lb (64.864 kg)  11/22/15 144 lb (65.318 kg)  10/21/15 145 lb (65.772 kg)    Physical Exam  Constitutional: She appears well-developed and well-nourished. No distress.  Cardiovascular: Normal rate and regular rhythm.   Pulmonary/Chest: Effort normal and breath sounds normal.  Abdominal: She exhibits mass (fullness along left flank, left lower quadrant). There is no hepatosplenomegaly. There is tenderness in the left lower quadrant. There is no rigidity and no guarding.  Abdomen is full  Neurological: She is alert. She displays no tremor.  Skin: Rash (psoriatic erythematous lesions with some scale) noted. She is not diaphoretic. No pallor.    Results for orders placed or performed in visit on 11/29/15  CBC With Differential/Platelet  Result Value Ref Range   WBC 12.8 (H) 3.4 - 10.8 x10E3/uL   RBC 3.81 3.77 - 5.28 x10E6/uL   Hemoglobin 11.9 11.1 - 15.9 g/dL   Hematocrit 40.9 81.1 - 46.6 %   MCV 89 79 - 97 fL   MCH 31.2 26.6 - 33.0 pg   MCHC 35.0 31.5 - 35.7 g/dL   RDW 91.4 78.2 - 95.6 %   Platelets 247 150 - 379 x10E3/uL   Neutrophils 50 %   Lymphs 39 %   MID 11 %   Neutrophils Absolute 6.4 1.4 -  7.0 x10E3/uL   Lymphocytes Absolute 5.0 (H) 0.7 - 3.1 x10E3/uL   MID (Absolute) 1.4 0.1 - 1.6 X10E3/uL  UA/M w/rflx Culture, Routine  Result Value Ref Range   Specific Gravity, UA 1.010 1.005 - 1.030   pH, UA 7.0 5.0 - 7.5   Color, UA Yellow Yellow   Appearance Ur Clear Clear   Leukocytes, UA Negative Negative   Protein, UA Negative Negative/Trace   Glucose, UA Negative Negative   Ketones, UA Negative Negative   RBC, UA Negative Negative   Bilirubin, UA Negative Negative   Urobilinogen, Ur 0.2 0.2 - 1.0 mg/dL   Nitrite, UA Negative Negative  Comprehensive metabolic panel  Result Value Ref Range   Glucose 58  (L) 65 - 99 mg/dL   BUN 15 8 - 27 mg/dL   Creatinine, Ser 1.61 0.57 - 1.00 mg/dL   GFR calc non Af Amer 67 >59 mL/min/1.73   GFR calc Af Amer 77 >59 mL/min/1.73   BUN/Creatinine Ratio 19 11 - 26   Sodium 133 (L) 134 - 144 mmol/L   Potassium 4.4 3.5 - 5.2 mmol/L   Chloride 91 (L) 96 - 106 mmol/L   CO2 23 18 - 29 mmol/L   Calcium 10.0 8.7 - 10.3 mg/dL   Total Protein 7.3 6.0 - 8.5 g/dL   Albumin 4.2 3.5 - 4.7 g/dL   Globulin, Total 3.1 1.5 - 4.5 g/dL   Albumin/Globulin Ratio 1.4 1.1 - 2.5   Bilirubin Total <0.2 0.0 - 1.2 mg/dL   Alkaline Phosphatase 57 39 - 117 IU/L   AST 25 0 - 40 IU/L   ALT 22 0 - 32 IU/L      Assessment & Plan:   Problem List Items Addressed This Visit      Cardiovascular and Mediastinum   Essential hypertension, benign    Not at goal; patient will be seen in a few days to go over results of scan, recheck then; continue ARB      Aorto-iliac atherosclerosis (HCC)     Musculoskeletal and Integument   Degenerative disc disease, lumbar   Degenerative joint disease (DJD) of hip     Other   Dyslipidemia    Check lipids      Chronic left flank pain    xrays have been done, awaiting CT scan of abdomen and pelvis; stool negative for blood; urine checked today, negative for blood      Relevant Orders   CBC With Differential/Platelet (Completed)   UA/M w/rflx Culture, Routine (Completed)   Comprehensive metabolic panel (Completed)   Gait abnormality    With significant arthritis, seeing orthopaedist later this week; has mobility scooter      Cough    She has had asthma for a long time was what I understood at one point, but then not diagnosed with asthma (?); translation difficulties, not sure if actually asthma; will get chest xray; we did not have time today to do spirometry; will get at follow-up      Relevant Orders   DG Chest 2 View (Completed)   Low back pain    She goes to see orthopaedist on Friday; xrays reviewed; she has tramadol that she  can use; she now has the scooter for mobility as well      Pelvic pain in female    Check urine today; CT scan of abdomen and pelvis to look for mass; discussed with patient and daughter that work-up will include CT scan and labs; translation difficulties; uterus  is gone, one ovary may remain, not sure; refer to gyn or gen surg if ovarian cancer or colon cancer or other condition; stool cards were negative for blood; close f/u later this week to go over scan results      Relevant Orders   CBC With Differential/Platelet (Completed)   UA/M w/rflx Culture, Routine (Completed)   Abdominal bloating - Primary    Check labs today; CT scan of abd and pelvis to look for ovarian mass, colon mass (with constipation); close f/u with appt to go over CT results in person given language barrier      Relevant Orders   CBC With Differential/Platelet (Completed)   UA/M w/rflx Culture, Routine (Completed)   Comprehensive metabolic panel (Completed)       Follow up plan: Return in about 3 days (around 12/02/2015) for Thursday afternoon to go over the scan results.  An after-visit summary was printed and given to the patient at check-out.  Please see the patient instructions which may contain other information and recommendations beyond what is mentioned above in the assessment and plan. Face-to-face time with patient was more than 25 minutes, >50% time spent counseling and coordination of care

## 2015-11-29 NOTE — Assessment & Plan Note (Signed)
Check lipids 

## 2015-11-29 NOTE — Assessment & Plan Note (Addendum)
Check labs today; CT scan of abd and pelvis to look for ovarian mass, colon mass (with constipation); close f/u with appt to go over CT results in person given language barrier

## 2015-11-30 ENCOUNTER — Ambulatory Visit
Admission: RE | Admit: 2015-11-30 | Discharge: 2015-11-30 | Disposition: A | Discharge status: Home / Self Care | Payer: Medicare Other | Source: Ambulatory Visit | Attending: Family Medicine | Admitting: Family Medicine

## 2015-11-30 DIAGNOSIS — R05 Cough: Secondary | ICD-10-CM | POA: Diagnosis not present

## 2015-11-30 DIAGNOSIS — N83202 Unspecified ovarian cyst, left side: Secondary | ICD-10-CM | POA: Diagnosis not present

## 2015-11-30 DIAGNOSIS — R102 Pelvic and perineal pain: Secondary | ICD-10-CM | POA: Diagnosis present

## 2015-11-30 DIAGNOSIS — M4690 Unspecified inflammatory spondylopathy, site unspecified: Secondary | ICD-10-CM | POA: Insufficient documentation

## 2015-11-30 DIAGNOSIS — R1900 Intra-abdominal and pelvic swelling, mass and lump, unspecified site: Secondary | ICD-10-CM | POA: Diagnosis not present

## 2015-11-30 DIAGNOSIS — M545 Low back pain: Secondary | ICD-10-CM | POA: Insufficient documentation

## 2015-11-30 LAB — COMPREHENSIVE METABOLIC PANEL
ALBUMIN: 4.2 g/dL (ref 3.5–4.7)
ALK PHOS: 57 IU/L (ref 39–117)
ALT: 22 IU/L (ref 0–32)
AST: 25 IU/L (ref 0–40)
Albumin/Globulin Ratio: 1.4 (ref 1.1–2.5)
BUN/Creatinine Ratio: 19 (ref 11–26)
BUN: 15 mg/dL (ref 8–27)
CHLORIDE: 91 mmol/L — AB (ref 96–106)
CO2: 23 mmol/L (ref 18–29)
Calcium: 10 mg/dL (ref 8.7–10.3)
Creatinine, Ser: 0.79 mg/dL (ref 0.57–1.00)
GFR calc Af Amer: 77 mL/min/{1.73_m2} (ref >59)
GFR calc non Af Amer: 67 mL/min/{1.73_m2} (ref >59)
GLOBULIN, TOTAL: 3.1 g/dL (ref 1.5–4.5)
Glucose: 58 mg/dL — ABNORMAL LOW (ref 65–99)
POTASSIUM: 4.4 mmol/L (ref 3.5–5.2)
Sodium: 133 mmol/L — ABNORMAL LOW (ref 134–144)
Total Protein: 7.3 g/dL (ref 6.0–8.5)

## 2015-11-30 MED ORDER — IOHEXOL 300 MG/ML  SOLN
100.0000 mL | Freq: Once | INTRAMUSCULAR | Status: AC | PRN
  Administered 2015-11-30: 100 mL via INTRAVENOUS

## 2015-12-02 ENCOUNTER — Encounter: Payer: Self-pay | Admitting: Family Medicine

## 2015-12-02 ENCOUNTER — Ambulatory Visit (INDEPENDENT_AMBULATORY_CARE_PROVIDER_SITE_OTHER): Payer: Medicare Other | Admitting: Family Medicine

## 2015-12-02 ENCOUNTER — Telehealth: Payer: Self-pay | Admitting: Surgery

## 2015-12-02 ENCOUNTER — Ambulatory Visit: Payer: Self-pay | Admitting: Family Medicine

## 2015-12-02 VITALS — BP 162/82 | HR 93 | Temp 98.7°F | Ht 59.0 in | Wt 144.0 lb

## 2015-12-02 DIAGNOSIS — I70299 Other atherosclerosis of native arteries of extremities, unspecified extremity: Secondary | ICD-10-CM | POA: Diagnosis not present

## 2015-12-02 DIAGNOSIS — I700 Atherosclerosis of aorta: Secondary | ICD-10-CM

## 2015-12-02 DIAGNOSIS — R05 Cough: Secondary | ICD-10-CM | POA: Diagnosis not present

## 2015-12-02 DIAGNOSIS — R1900 Intra-abdominal and pelvic swelling, mass and lump, unspecified site: Secondary | ICD-10-CM | POA: Diagnosis not present

## 2015-12-02 DIAGNOSIS — R059 Cough, unspecified: Secondary | ICD-10-CM

## 2015-12-02 DIAGNOSIS — D72829 Elevated white blood cell count, unspecified: Secondary | ICD-10-CM | POA: Diagnosis not present

## 2015-12-02 MED ORDER — PRAVASTATIN SODIUM 20 MG PO TABS: 20.0000 mg | ORAL_TABLET | Freq: Every day | ORAL | Status: DC
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
## 2015-12-02 NOTE — Assessment & Plan Note (Addendum)
Spirometry today; reviewed xray results with patient and daughter

## 2015-12-02 NOTE — Assessment & Plan Note (Signed)
Not at goal; patient will be seen in a few days to go over results of scan, recheck then; continue ARB

## 2015-12-02 NOTE — Telephone Encounter (Signed)
I have called Junyoung Ham (Son) to discuss referral for patient to see General Surgery. He will call me back to make the appointment.

## 2015-12-02 NOTE — Assessment & Plan Note (Signed)
With significant arthritis, seeing orthopaedist later this week; has mobility scooter

## 2015-12-02 NOTE — Assessment & Plan Note (Signed)
She goes to see orthopaedist on Friday; xrays reviewed; she has tramadol that she can use; she now has the scooter for mobility as well

## 2015-12-02 NOTE — Patient Instructions (Addendum)
We'll refer you to a surgeon to have this evaluated If you develop any serious pain or bloating, then please go to the emergency department You can use tramadol as prescribed if needed for pain

## 2015-12-02 NOTE — Telephone Encounter (Signed)
Patient refused to make appointment. I have advised the son that she needs to be seen urgently for the pelvic mass. He stated that she did not want to make an appointment. I told him to please call us back if she changed her mind.

## 2015-12-03 DIAGNOSIS — M47816 Spondylosis without myelopathy or radiculopathy, lumbar region: Secondary | ICD-10-CM | POA: Diagnosis not present

## 2015-12-03 NOTE — Progress Notes (Signed)
done

## 2015-12-04 ENCOUNTER — Telehealth: Payer: Self-pay | Admitting: Family Medicine

## 2015-12-04 NOTE — Telephone Encounter (Signed)
-----   Message from Marshall Cork, [place] sent at 12/03/2015 10:03 AM EST ----- General Surgery called patient to schedule an appointment and she refused.   They urged her the importance to be seen for the pelvic mass, but she still refused and said she didn't want an appointment.  They told her the son that if she changes her mind, to call. So it's now it patient hands what she decides to do.   Not sure if you wanted to talk to the son or daughter or somebody about this situation. Let me know.   Thank you!

## 2015-12-04 NOTE — Telephone Encounter (Signed)
Thank you for the update. I respect patient's autonomy and her choice to refuse care/treatment.

## 2015-12-08 ENCOUNTER — Ambulatory Visit: Payer: Self-pay | Admitting: Surgery

## 2015-12-12 NOTE — Assessment & Plan Note (Signed)
Scans ordered

## 2015-12-12 NOTE — Assessment & Plan Note (Signed)
Patient to see eye specialist

## 2015-12-12 NOTE — Assessment & Plan Note (Signed)
Patient to f/u with orthopaedics 

## 2015-12-12 NOTE — Assessment & Plan Note (Signed)
Patient to get the previously ordered xrays and then f/u with orthopaedics

## 2015-12-12 NOTE — Assessment & Plan Note (Signed)
With back pain, hip pain, knee pain, significant arthritis; so glad that she has received her morotized wheelchair (though she does not have it today)

## 2015-12-19 DIAGNOSIS — D72829 Elevated white blood cell count, unspecified: Secondary | ICD-10-CM | POA: Insufficient documentation

## 2015-12-19 NOTE — Assessment & Plan Note (Signed)
Reviewed scan results with patient and daughter in detail, through interpreter; will refer to surgeon for evaluation with appropriate treatment, discussion of ddx, best means for diagnosis, treatment

## 2015-12-19 NOTE — Assessment & Plan Note (Signed)
Reviewed CBC; patient and daughter did not want repeat CBC today, even after explanation that there may be infection, last reading was elevated

## 2016-01-19 DIAGNOSIS — E118 Type 2 diabetes mellitus with unspecified complications: Secondary | ICD-10-CM | POA: Diagnosis not present

## 2016-01-19 DIAGNOSIS — E1165 Type 2 diabetes mellitus with hyperglycemia: Secondary | ICD-10-CM | POA: Diagnosis not present

## 2016-01-27 DIAGNOSIS — Z79899 Other long term (current) drug therapy: Secondary | ICD-10-CM | POA: Diagnosis not present

## 2016-01-27 DIAGNOSIS — E118 Type 2 diabetes mellitus with unspecified complications: Secondary | ICD-10-CM | POA: Diagnosis not present

## 2016-01-27 DIAGNOSIS — E1165 Type 2 diabetes mellitus with hyperglycemia: Secondary | ICD-10-CM | POA: Diagnosis not present

## 2016-02-01 ENCOUNTER — Other Ambulatory Visit: Payer: Self-pay | Admitting: Family Medicine

## 2016-02-02 NOTE — Telephone Encounter (Signed)
Last Cr and K+ reviewed; Rx approved 

## 2016-02-03 ENCOUNTER — Other Ambulatory Visit: Payer: Self-pay | Admitting: Family Medicine

## 2016-02-03 NOTE — Telephone Encounter (Signed)
I just approved this yesterday; please resolve with pharmacy; thank you

## 2016-02-03 NOTE — Telephone Encounter (Signed)
Spoke with pharmacy, they did have the rx.

## 2016-02-16 ENCOUNTER — Ambulatory Visit
Admission: RE | Admit: 2016-02-16 | Discharge: 2016-02-16 | Disposition: A | Discharge status: Home / Self Care | Payer: Medicare Other | Source: Ambulatory Visit | Attending: Family Medicine | Admitting: Family Medicine

## 2016-02-16 ENCOUNTER — Encounter: Payer: Self-pay | Admitting: Family Medicine

## 2016-02-16 ENCOUNTER — Ambulatory Visit (INDEPENDENT_AMBULATORY_CARE_PROVIDER_SITE_OTHER): Payer: Medicare Other | Admitting: Family Medicine

## 2016-02-16 VITALS — BP 140/62 | HR 92 | Temp 97.9°F | Resp 16 | Wt 143.0 lb

## 2016-02-16 DIAGNOSIS — R1900 Intra-abdominal and pelvic swelling, mass and lump, unspecified site: Secondary | ICD-10-CM | POA: Diagnosis not present

## 2016-02-16 DIAGNOSIS — I10 Essential (primary) hypertension: Secondary | ICD-10-CM

## 2016-02-16 DIAGNOSIS — I70299 Other atherosclerosis of native arteries of extremities, unspecified extremity: Secondary | ICD-10-CM | POA: Diagnosis not present

## 2016-02-16 DIAGNOSIS — H578 Other specified disorders of eye and adnexa: Secondary | ICD-10-CM | POA: Diagnosis not present

## 2016-02-16 DIAGNOSIS — I700 Atherosclerosis of aorta: Secondary | ICD-10-CM | POA: Diagnosis not present

## 2016-02-16 DIAGNOSIS — M5032 Other cervical disc degeneration, mid-cervical region, unspecified level: Secondary | ICD-10-CM | POA: Diagnosis not present

## 2016-02-16 DIAGNOSIS — G8929 Other chronic pain: Secondary | ICD-10-CM | POA: Insufficient documentation

## 2016-02-16 DIAGNOSIS — H04123 Dry eye syndrome of bilateral lacrimal glands: Secondary | ICD-10-CM | POA: Diagnosis not present

## 2016-02-16 DIAGNOSIS — H5789 Other specified disorders of eye and adnexa: Secondary | ICD-10-CM

## 2016-02-16 DIAGNOSIS — H04129 Dry eye syndrome of unspecified lacrimal gland: Secondary | ICD-10-CM | POA: Insufficient documentation

## 2016-02-16 DIAGNOSIS — M542 Cervicalgia: Secondary | ICD-10-CM

## 2016-02-16 DIAGNOSIS — M50323 Other cervical disc degeneration at C6-C7 level: Secondary | ICD-10-CM | POA: Insufficient documentation

## 2016-02-16 DIAGNOSIS — E1165 Type 2 diabetes mellitus with hyperglycemia: Secondary | ICD-10-CM | POA: Diagnosis not present

## 2016-02-16 MED ORDER — CLOBETASOL PROPIONATE 0.05 % EX OINT: 1.0000 "application " | TOPICAL_OINTMENT | Freq: Two times a day (BID) | CUTANEOUS | Status: DC

## 2016-02-16 MED ORDER — DICLOFENAC SODIUM 1 % TD GEL: 2.0000 g | Freq: Four times a day (QID) | TRANSDERMAL | Status: DC

## 2016-02-16 MED ORDER — ALENDRONATE SODIUM 70 MG PO TABS: 70.0000 mg | ORAL_TABLET | ORAL | Status: DC

## 2016-02-16 MED ORDER — PANTOPRAZOLE SODIUM 40 MG PO TBEC: 40.0000 mg | DELAYED_RELEASE_TABLET | Freq: Every day | ORAL | Status: DC

## 2016-02-16 MED ORDER — LOSARTAN POTASSIUM 100 MG PO TABS: 100.0000 mg | ORAL_TABLET | Freq: Every day | ORAL | Status: DC

## 2016-02-16 MED ORDER — PRAVASTATIN SODIUM 20 MG PO TABS: 20.0000 mg | ORAL_TABLET | Freq: Every day | ORAL | Status: DC

## 2016-02-16 NOTE — Assessment & Plan Note (Addendum)
Order follow-up imaging; patient did not want any further work-up or referrals after last scan; she is feeling this area getting larger; will get studies and then discuss options; consider malignancy, though weight is stable, appetite and energy are stable

## 2016-02-16 NOTE — Progress Notes (Signed)
BP 140/62 mmHg  Pulse 92  Temp(Src) 97.9 F (36.6 C) (Oral)  Resp 16  Wt 143 lb (64.864 kg)  SpO2 94%   Subjective:    Patient ID: Tasha Rivera, female    DOB: 29-Sep-1924, 82 y.o.   MRN: [id number]  HPI: Tasha Rivera is a 82 y.o. female  Chief Complaint  Patient presents with  . Medication Refill  . Diabetes    see's Endocrinolgist  . Hypertension  . Hyperlipidemia  . Gastroesophageal Reflux  . Abdominal Pain    left side LLQ.  Patient states she feels a lump  . Eye Problem    irritation and reddness would like referral   Patient is here with daughter and Korean-speaking interpreter No pain, but feels a lump on the left side; moving up into the LLQ; no fever, no blood in the stool or urine; irritating her stomach; size of her finger; no weight loss; good appetite; she was found to have a mass in the left side pelvis a few months ago but patient did not want to pursue work-up at that time, wanting instead to leave it up to God and pray for healing Sharp pain in the head on the left side at times; one time she fell and hurt her head left side; sharp pain still from time to time; stiff neck; she agrees that it might be pinched nerve Sees endocrinologist for diabetes; yesterday blood sugar was 155; no low sugars; 130s to 160s Having a problem with the eye; she has dryness and redness; using eye drops, but would like to see eye doctor On medicine for GERD; no problems at all; no blood in the stool On medicine alendronate, and no problems at all there; multivitamin has some calcium; drinks milk  Relevant past medical, surgical, family and social history reviewed and updated as indicated. Interim medical history since our last visit reviewed. Allergies and medications reviewed and updated.  Review of Systems  Constitutional: Negative for unexpected weight change.  Eyes: Positive for redness.  Gastrointestinal: Positive for abdominal pain.  Genitourinary: Positive for pelvic pain.    Per HPI unless specifically indicated above     Objective:    BP 140/62 mmHg  Pulse 92  Temp(Src) 97.9 F (36.6 C) (Oral)  Resp 16  Wt 143 lb (64.864 kg)  SpO2 94%  Wt Readings from Last 3 Encounters:  03/08/16 143 lb (64.864 kg)  02/16/16 143 lb (64.864 kg)  12/02/15 144 lb (65.318 kg)    Physical Exam  Constitutional: She appears well-developed and well-nourished. No distress.  Weight stable  Eyes: No scleral icterus.  Neck: No JVD present.  Cardiovascular: Normal rate and regular rhythm.   Pulmonary/Chest: Effort normal and breath sounds normal.  Abdominal: She exhibits no distension. There is tenderness (LLQ). There is no guarding.  Musculoskeletal: She exhibits no edema.  Neurological: She is alert.  Skin: Skin is warm. No pallor.  Psychiatric: She has a normal mood and affect.    Results for orders placed or performed in visit on 11/29/15  CBC With Differential/Platelet  Result Value Ref Range   WBC 12.8 (H) 3.4 - 10.8 x10E3/uL   RBC 3.81 3.77 - 5.28 x10E6/uL   Hemoglobin 11.9 11.1 - 15.9 g/dL   Hematocrit 40.9 81.1 - 46.6 %   MCV 89 79 - 97 fL   MCH 31.2 26.6 - 33.0 pg   MCHC 35.0 31.5 - 35.7 g/dL   RDW 91.4 78.2 - 95.6 %   Platelets  247 150 - 379 x10E3/uL   Neutrophils 50 %   Lymphs 39 %   MID 11 %   Neutrophils Absolute 6.4 1.4 - 7.0 x10E3/uL   Lymphocytes Absolute 5.0 (H) 0.7 - 3.1 x10E3/uL   MID (Absolute) 1.4 0.1 - 1.6 X10E3/uL  UA/M w/rflx Culture, Routine  Result Value Ref Range   Specific Gravity, UA 1.010 1.005 - 1.030   pH, UA 7.0 5.0 - 7.5   Color, UA Yellow Yellow   Appearance Ur Clear Clear   Leukocytes, UA Negative Negative   Protein, UA Negative Negative/Trace   Glucose, UA Negative Negative   Ketones, UA Negative Negative   RBC, UA Negative Negative   Bilirubin, UA Negative Negative   Urobilinogen, Ur 0.2 0.2 - 1.0 mg/dL   Nitrite, UA Negative Negative  Comprehensive metabolic panel  Result Value Ref Range   Glucose 58 (L) 65 -  99 mg/dL   BUN 15 8 - 27 mg/dL   Creatinine, Ser 1.610.79 0.57 - 1.00 mg/dL   GFR calc non Af Amer 67 >59 mL/min/1.73   GFR calc Af Amer 77 >59 mL/min/1.73   BUN/Creatinine Ratio 19 11 - 26   Sodium 133 (L) 134 - 144 mmol/L   Potassium 4.4 3.5 - 5.2 mmol/L   Chloride 91 (L) 96 - 106 mmol/L   CO2 23 18 - 29 mmol/L   Calcium 10.0 8.7 - 10.3 mg/dL   Total Protein 7.3 6.0 - 8.5 g/dL   Albumin 4.2 3.5 - 4.7 g/dL   Globulin, Total 3.1 1.5 - 4.5 g/dL   Albumin/Globulin Ratio 1.4 1.1 - 2.5   Bilirubin Total <0.2 0.0 - 1.2 mg/dL   Alkaline Phosphatase 57 39 - 117 IU/L   AST 25 0 - 40 IU/L   ALT 22 0 - 32 IU/L      Assessment & Plan:   Problem List Items Addressed This Visit      Cardiovascular and Mediastinum   Essential hypertension, benign    Well-controlled today; continue ARB, refills provided; last Cr and K+ from endo reviewed      Relevant Medications   pravastatin (PRAVACHOL) 20 MG tablet   losartan (COZAAR) 100 MG tablet     Other   Type 2 diabetes mellitus with hyperglycemia (HCC)    Managed by endocrinologist; glad to hear that her sugars are under good control      Relevant Medications   TRADJENTA 5 MG TABS tablet   pravastatin (PRAVACHOL) 20 MG tablet   losartan (COZAAR) 100 MG tablet   Pelvic mass in female    Order follow-up imaging; patient did not want any further work-up or referrals after last scan; she is feeling this area getting larger; will get studies and then discuss options; consider malignancy, though weight is stable, appetite and energy are stable      Relevant Orders   CT Abdomen Pelvis W Contrast (Completed)   Neck pain, chronic - Primary    Suspect arthritis; imaging ordered      Relevant Orders   DG Cervical Spine Complete (Completed)   Eye dryness    Refer to eye doctor      Relevant Orders   Ambulatory referral to Ophthalmology   Eye redness    Refer to eye doctor      Relevant Orders   Ambulatory referral to Ophthalmology         Follow up plan: Return in about 8 weeks (around 04/12/2016) for 40 minute visit, with fasting labs.  An after-visit summary was printed and given to the patient at check-out.  Please see the patient instructions which may contain other information and recommendations beyond what is mentioned above in the assessment and plan.  Orders Placed This Encounter  Procedures  . DG Cervical Spine Complete  . CT Abdomen Pelvis W Contrast  . Ambulatory referral to Ophthalmology

## 2016-02-16 NOTE — Assessment & Plan Note (Signed)
Well-controlled today; continue ARB, refills provided; last Cr and K+ from endo reviewed

## 2016-02-16 NOTE — Patient Instructions (Addendum)
We'll have you get a CT scan and neck xrays soon   Try over-the-counter lubricating drops for the eyes, and your pharmacist can help you find a good product   We'll have you see the eye doctor   Return for fasting labs and visit with me on or just after June 7th (cholesterol, etc.)   If you have not heard anything from my staff in a week about any orders/referrals/studies from today, please contact us here to follow-up [phone]

## 2016-02-18 ENCOUNTER — Other Ambulatory Visit: Payer: Self-pay | Admitting: Family Medicine

## 2016-02-18 ENCOUNTER — Inpatient Hospital Stay: Admission: RE | Admit: 2016-02-18 | Payer: Medicare Other | Source: Ambulatory Visit

## 2016-02-18 ENCOUNTER — Encounter: Payer: Self-pay | Admitting: Family Medicine

## 2016-02-18 DIAGNOSIS — I6529 Occlusion and stenosis of unspecified carotid artery: Secondary | ICD-10-CM | POA: Insufficient documentation

## 2016-02-18 DIAGNOSIS — I6523 Occlusion and stenosis of bilateral carotid arteries: Secondary | ICD-10-CM

## 2016-02-18 DIAGNOSIS — M47812 Spondylosis without myelopathy or radiculopathy, cervical region: Secondary | ICD-10-CM | POA: Insufficient documentation

## 2016-02-23 ENCOUNTER — Other Ambulatory Visit: Payer: Medicare Other

## 2016-02-23 ENCOUNTER — Ambulatory Visit
Admission: RE | Admit: 2016-02-23 | Discharge: 2016-02-23 | Disposition: A | Discharge status: Home / Self Care | Payer: Medicare Other | Source: Ambulatory Visit | Attending: Family Medicine | Admitting: Family Medicine

## 2016-02-23 DIAGNOSIS — N2889 Other specified disorders of kidney and ureter: Secondary | ICD-10-CM | POA: Insufficient documentation

## 2016-02-23 DIAGNOSIS — J9811 Atelectasis: Secondary | ICD-10-CM | POA: Insufficient documentation

## 2016-02-23 DIAGNOSIS — N281 Cyst of kidney, acquired: Secondary | ICD-10-CM | POA: Insufficient documentation

## 2016-02-23 DIAGNOSIS — R1900 Intra-abdominal and pelvic swelling, mass and lump, unspecified site: Secondary | ICD-10-CM | POA: Insufficient documentation

## 2016-02-23 DIAGNOSIS — K760 Fatty (change of) liver, not elsewhere classified: Secondary | ICD-10-CM | POA: Diagnosis not present

## 2016-02-23 MED ORDER — IOPAMIDOL (ISOVUE-300) INJECTION 61%
100.0000 mL | Freq: Once | INTRAVENOUS | Status: AC | PRN
  Administered 2016-02-23: 100 mL via INTRAVENOUS

## 2016-03-08 ENCOUNTER — Encounter: Payer: Self-pay | Admitting: Family Medicine

## 2016-03-08 ENCOUNTER — Ambulatory Visit (INDEPENDENT_AMBULATORY_CARE_PROVIDER_SITE_OTHER): Payer: Medicare Other | Admitting: Family Medicine

## 2016-03-08 VITALS — BP 144/60 | HR 94 | Temp 97.4°F | Resp 16 | Wt 143.0 lb

## 2016-03-08 DIAGNOSIS — I6523 Occlusion and stenosis of bilateral carotid arteries: Secondary | ICD-10-CM | POA: Diagnosis not present

## 2016-03-08 DIAGNOSIS — I70299 Other atherosclerosis of native arteries of extremities, unspecified extremity: Secondary | ICD-10-CM | POA: Diagnosis not present

## 2016-03-08 DIAGNOSIS — R1900 Intra-abdominal and pelvic swelling, mass and lump, unspecified site: Secondary | ICD-10-CM | POA: Diagnosis not present

## 2016-03-08 DIAGNOSIS — I700 Atherosclerosis of aorta: Secondary | ICD-10-CM | POA: Diagnosis not present

## 2016-03-08 MED ORDER — ASPIRIN EC 81 MG PO TBEC: 81.0000 mg | DELAYED_RELEASE_TABLET | Freq: Every day | ORAL | Status: AC

## 2016-03-08 NOTE — Assessment & Plan Note (Signed)
Start aspirin, get carotid [place]

## 2016-03-08 NOTE — Progress Notes (Signed)
BP 144/60 mmHg  Pulse 94  Temp(Src) 97.4 F (36.3 C) (Oral)  Resp 16  Wt 143 lb (64.864 kg)  SpO2 96%   Subjective:    Patient ID: Tasha Rivera, female    DOB: 13-Sep-1924, 82 y.o.   MRN: [id number]  HPI: Tasha Rivera is a 82 y.o. female  Chief Complaint  Patient presents with  . Results    here to discuss lab results   She is here with duaghter and translater No fevers; little nausea the other day; no nausea; appetite is good Here to go over the CT scan  CT Abdomen Pelvis W Contrast   Status: Final result       PACS Images     Show images for CT Abdomen Pelvis W Contrast     Study Result     CLINICAL DATA: F/U CT A/P 11/30/2015. She states she just has pelvic cramping. NKI. Hx Ovarian CA with Hysterectomy. Hx Appendectomy.  EXAM: CT ABDOMEN AND PELVIS WITH CONTRAST  TECHNIQUE: Multidetector CT imaging of the abdomen and pelvis was performed using the standard protocol following bolus administration of intravenous contrast.  CONTRAST: ISOVUE-300 IOPAMIDOL (ISOVUE-300) INJECTION 61%  COMPARISON: 11/30/2015  FINDINGS: Lung bases: Minor subsegmental atelectasis. No masses or nodules. Heart normal in size.  Hepatobiliary: Fatty infiltration of the liver. Liver normal in size. No mass or focal lesion. Gallbladder and biliary tree are unremarkable.  Spleen, pancreas, adrenal glands: Normal.  Kidneys, ureters, bladder: Area of renal scarring along the posterior aspect of the right kidney. Small low-density lesions in the left kidney and 1 from the upper pole the right kidney, all consistent with cysts and stable. Calcification in the left renal hilum that is likely vascular. No hydronephrosis. Ureters normal course and in caliber. Bladder is unremarkable.  Uterus and adnexa: Uterus surgically absent. Cystic mass lies posterior to the bladder in the cul-de-sac. There is a focus of dystrophic appearing calcification along its inferior  margin. The cystic mass measures 7.3 x 5.2 x 6.8 cm, previously 6.9 x 5.0 x 6.4 cm. The dystrophic appearing calcification along the inferior margin of the cystic mass is new since the prior exam.  Lymph nodes: No adenopathy.  Ascites: None.  Gastrointestinal: Stomach, small bowel and colon are unremarkable. Appendix has been removed.  Musculoskeletal: Degenerative changes of the visualized spine. Bones are demineralized. Small sclerotic focus in the T12 vertebrae is likely a bone island. It is stable. There are no osteo lytic lesions.  IMPRESSION: 1. Cystic mass in the pelvis has mildly increased in size, now measuring 7.3 x 5.2 x 6.8 cm, previously 6.9 x 5.0 x 6.4 cm. There is new dystrophic appearing calcification along its inferior margin. A cystic neoplasm is suspected given the mild interval growth. This still could reflect a paraovarian cyst or inclusion cyst. 2. There is no CT evidence of locally invasive carcinoma or of metastatic disease. 3. No acute findings. 4. Hepatic steatosis. 5. Stable right renal scarring. Small renal cysts, also stable.   Electronically Signed  By: Amie Portland M.D.  On: 02/23/2016 15:37     Depression screen PHQ 2/9 02/16/2016  Decreased Interest 0  Down, Depressed, Hopeless 0  PHQ - 2 Score 0   Relevant past medical, surgical, family and social history reviewed Past Medical History  Diagnosis Date  . Hyperlipidemia   . Hypertension   . Diabetes mellitus without complication (HCC)   . DJD (degenerative joint disease)   . History of ovarian cancer  she states approximately 60 years ago.  . Carotid atherosclerosis 2017   Past Surgical History  Procedure Laterality Date  . Appendectomy    . Abdominal hysterectomy      partial, due to ovarian cancer  . Cataract extraction     Family History  Problem Relation Age of Onset  . Cancer Son 7640    stomach   Social History  Substance Use Topics  . Smoking status:  Never Smoker   . Smokeless tobacco: Never Used  . Alcohol Use: No   Interim medical history since last visit reviewed. Allergies and medications reviewed  Review of Systems Per HPI unless specifically indicated above     Objective:    BP 144/60 mmHg  Pulse 94  Temp(Src) 97.4 F (36.3 C) (Oral)  Resp 16  Wt 143 lb (64.864 kg)  SpO2 96%  Wt Readings from Last 3 Encounters:  03/15/16 139 lb (63.05 kg)  03/08/16 143 lb (64.864 kg)  02/16/16 143 lb (64.864 kg)    Physical Exam  Constitutional: She appears well-developed and well-nourished.  HENT:  Head: Normocephalic and atraumatic.  Mouth/Throat: Mucous membranes are normal.  Eyes: EOM are normal. No scleral icterus.  Cardiovascular: Normal rate and regular rhythm.   Pulmonary/Chest: Effort normal and breath sounds normal.  Abdominal: She exhibits no distension. There is no guarding.  Fullness and mild tenderness along left lower quadrant, left colic gutter  Neurological: She is alert.  Skin: Skin is warm. No pallor.  Psychiatric: She has a normal mood and affect. Her behavior is normal.       Assessment & Plan:   Problem List Items Addressed This Visit      Cardiovascular and Mediastinum   Carotid atherosclerosis    Start aspirin, get carotid US      Relevant Medications   aspirin EC 81 MG tablet   Other Relevant Orders   US Carotid Duplex Bilateral     Other   Pelvic mass in female - Primary    Refer to oncologist to discuss options; explained this might be cancer; cultural aspects kept in mind, but possible diagnosis was disclosed to patient through interpeter; she sounds interested in chemo or radiation       Relevant Orders   Ambulatory referral to Oncology      Follow up plan: No Follow-up on file.  An after-visit summary was printed and given to the patient at check-out.  Please see the patient instructions which may contain other information and recommendations beyond what is mentioned above in  the assessment and plan.  Meds ordered this encounter  Medications  . aspirin EC 81 MG tablet    Sig: Take 1 tablet (81 mg total) by mouth daily.    Dispense:  30 tablet    Refill:  11    Orders Placed This Encounter  Procedures  . US Carotid Duplex Bilateral  . Ambulatory referral to Oncology   Face-to-face time with patient was more than 25 minutes, >50% time spent counseling and coordination of care Extra time was needed with [place]-speaking interpreter, discussion of findings, explanations, listening to what patient desires, her concerns and daughters concerns

## 2016-03-08 NOTE — Assessment & Plan Note (Signed)
Refer to oncologist to discuss options; explained this might be cancer; cultural aspects kept in mind, but possible diagnosis was disclosed to patient through interpeter; she sounds interested in chemo or radiation

## 2016-03-08 NOTE — Patient Instructions (Addendum)
We'll have you see the oncologist see you soon for this place in the abdomen/pelvis Start baby aspirin daily to help prevent a stroke We'll get a scan of your neck and then have you see an artery specialist  Stroke Prevention Some medical conditions and behaviors are associated with an increased chance of having a stroke. You may prevent a stroke by making healthy choices and managing medical conditions. HOW CAN I REDUCE MY RISK OF HAVING A STROKE?   Stay physically active. Get at least 30 minutes of activity on most or all days.  Do not smoke. It may also be helpful to avoid exposure to secondhand smoke.  Limit alcohol use. Moderate alcohol use is considered to be:  No more than 2 drinks per day for men.  No more than 1 drink per day for nonpregnant women.  Eat healthy foods. This involves:  Eating 5 or more servings of fruits and vegetables a day.  Making dietary changes that address high blood pressure (hypertension), high cholesterol, diabetes, or obesity.  Manage your cholesterol levels.  Making food choices that are high in fiber and low in saturated fat, trans fat, and cholesterol may control cholesterol levels.  Take any prescribed medicines to control cholesterol as directed by your health care provider.  Manage your diabetes.  Controlling your carbohydrate and sugar intake is recommended to manage diabetes.  Take any prescribed medicines to control diabetes as directed by your health care provider.  Control your hypertension.  Making food choices that are low in salt (sodium), saturated fat, trans fat, and cholesterol is recommended to manage hypertension.  Ask your health care provider if you need treatment to lower your blood pressure. Take any prescribed medicines to control hypertension as directed by your health care provider.  If you are 6218-80 years of age, have your blood pressure checked every 3-5 years. If you are 80 years of age or older, have your blood  pressure checked every year.  Maintain a healthy weight.  Reducing calorie intake and making food choices that are low in sodium, saturated fat, trans fat, and cholesterol are recommended to manage weight.  Stop drug abuse.  Avoid taking birth control pills.  Talk to your health care provider about the risks of taking birth control pills if you are over 80 years old, smoke, get migraines, or have ever had a blood clot.  Get evaluated for sleep disorders (sleep apnea).  Talk to your health care provider about getting a sleep evaluation if you snore a lot or have excessive sleepiness.  Take medicines only as directed by your health care provider.  For some people, aspirin or blood thinners (anticoagulants) are helpful in reducing the risk of forming abnormal blood clots that can lead to stroke. If you have the irregular heart rhythm of atrial fibrillation, you should be on a blood thinner unless there is a good reason you cannot take them.  Understand all your medicine instructions.  Make sure that other conditions (such as anemia or atherosclerosis) are addressed. SEEK IMMEDIATE MEDICAL CARE IF:   You have sudden weakness or numbness of the face, arm, or leg, especially on one side of the body.  Your face or eyelid droops to one side.  You have sudden confusion.  You have trouble speaking (aphasia) or understanding.  You have sudden trouble seeing in one or both eyes.  You have sudden trouble walking.  You have dizziness.  You have a loss of balance or coordination.  You have  a sudden, severe headache with no known cause.  You have new chest pain or an irregular heartbeat. Any of these symptoms may represent a serious problem that is an emergency. Do not wait to see if the symptoms will go away. Get medical help at once. Call your local emergency services (911 in [place] Do not drive yourself to the hospital.   This information is not intended to replace advice given to  you by your health care provider. Make sure you discuss any questions you have with your health care provider.   Document Released: 11/30/2004 Document Revised: 11/13/2014 Document Reviewed: 04/25/2013 Elsevier Interactive Patient Education [employer]

## 2016-03-10 NOTE — Assessment & Plan Note (Signed)
Managed by endocrinologist; glad to hear that her sugars are under good control

## 2016-03-10 NOTE — Assessment & Plan Note (Signed)
Refer to eye doctor 

## 2016-03-10 NOTE — Assessment & Plan Note (Signed)
Suspect arthritis; imaging ordered

## 2016-03-15 ENCOUNTER — Inpatient Hospital Stay: Payer: Medicare Other | Attending: Obstetrics and Gynecology | Admitting: Obstetrics and Gynecology

## 2016-03-15 ENCOUNTER — Inpatient Hospital Stay: Payer: Medicare Other

## 2016-03-15 VITALS — BP 152/81 | HR 105 | Temp 98.2°F | Ht 62.21 in | Wt 139.0 lb

## 2016-03-15 DIAGNOSIS — M4692 Unspecified inflammatory spondylopathy, cervical region: Secondary | ICD-10-CM

## 2016-03-15 DIAGNOSIS — Z7984 Long term (current) use of oral hypoglycemic drugs: Secondary | ICD-10-CM | POA: Insufficient documentation

## 2016-03-15 DIAGNOSIS — M810 Age-related osteoporosis without current pathological fracture: Secondary | ICD-10-CM | POA: Diagnosis not present

## 2016-03-15 DIAGNOSIS — Z90710 Acquired absence of both cervix and uterus: Secondary | ICD-10-CM | POA: Diagnosis not present

## 2016-03-15 DIAGNOSIS — M170 Bilateral primary osteoarthritis of knee: Secondary | ICD-10-CM | POA: Insufficient documentation

## 2016-03-15 DIAGNOSIS — Z90721 Acquired absence of ovaries, unilateral: Secondary | ICD-10-CM | POA: Diagnosis not present

## 2016-03-15 DIAGNOSIS — E781 Pure hyperglyceridemia: Secondary | ICD-10-CM | POA: Diagnosis not present

## 2016-03-15 DIAGNOSIS — M1610 Unilateral primary osteoarthritis, unspecified hip: Secondary | ICD-10-CM | POA: Insufficient documentation

## 2016-03-15 DIAGNOSIS — M5136 Other intervertebral disc degeneration, lumbar region: Secondary | ICD-10-CM | POA: Diagnosis not present

## 2016-03-15 DIAGNOSIS — R61 Generalized hyperhidrosis: Secondary | ICD-10-CM | POA: Diagnosis not present

## 2016-03-15 DIAGNOSIS — Z7982 Long term (current) use of aspirin: Secondary | ICD-10-CM | POA: Diagnosis not present

## 2016-03-15 DIAGNOSIS — E1165 Type 2 diabetes mellitus with hyperglycemia: Secondary | ICD-10-CM | POA: Insufficient documentation

## 2016-03-15 DIAGNOSIS — Z1273 Encounter for screening for malignant neoplasm of ovary: Secondary | ICD-10-CM | POA: Insufficient documentation

## 2016-03-15 DIAGNOSIS — M79606 Pain in leg, unspecified: Secondary | ICD-10-CM

## 2016-03-15 DIAGNOSIS — I6529 Occlusion and stenosis of unspecified carotid artery: Secondary | ICD-10-CM

## 2016-03-15 DIAGNOSIS — Z79899 Other long term (current) drug therapy: Secondary | ICD-10-CM | POA: Diagnosis not present

## 2016-03-15 DIAGNOSIS — R1900 Intra-abdominal and pelvic swelling, mass and lump, unspecified site: Secondary | ICD-10-CM | POA: Diagnosis present

## 2016-03-15 DIAGNOSIS — Z1211 Encounter for screening for malignant neoplasm of colon: Secondary | ICD-10-CM | POA: Insufficient documentation

## 2016-03-15 DIAGNOSIS — I700 Atherosclerosis of aorta: Secondary | ICD-10-CM | POA: Diagnosis not present

## 2016-03-15 DIAGNOSIS — M549 Dorsalgia, unspecified: Secondary | ICD-10-CM | POA: Diagnosis not present

## 2016-03-15 NOTE — Progress Notes (Signed)
Gynecologic Oncology Consult Visit   Referring Provider: Dr Sanda Klein  Chief Concern: pelvic mass  Subjective:  Tasha Rivera is a 82 y.o. P69 female who is seen in consultation from Dr. Sanda Klein for pelvic mass.  Patient complained of abdominal pain in 1/17 and CT scan ordered.    She had a hysterectomy and in [place] around age 24, they left one ovary the daughter says; the hysterectomy was for cysts; five hour surgery, no cancer  11/30/15 CT scan IMPRESSION:  Multiloculated complex cystic pelvic mass, abutting the left adnexa. Differential diagnosis includes peritoneal inclusion cyst, paraovarian cyst, low-grade left ovarian malignancy if the left ovary is present. Pelvic abscess although possible is felt unlikely. Surgical consult is recommended.  02/23/16 CT scan IMPRESSION:  1. Cystic mass in the pelvis has mildly increased in size, now measuring 7.3 x 5.2 x 6.8 cm, previously 6.9 x 5.0 x 6.4 cm. There is new dystrophic appearing calcification along its inferior margin. A cystic neoplasm is suspected given the mild interval growth. This still could reflect a paraovarian cyst or inclusion cyst. 2. There is no CT evidence of locally invasive carcinoma or of metastatic disease. 3. No acute findings. 4. Hepatic steatosis. 5. Stable right renal scarring. Small renal cysts, also stable.  Patient says she no longer has any abdominal pain.  Has some urinary frequency, with voiding every 2-3 hours, but this is not changed in the past 5 years.   Accompanied by daughter and [place] interpreter.   ROS: Night sweats, headaches, hearing loss, rash, leg pain with walking, cough, pain in extremities and back.   Problem List: Patient Active Problem List   Diagnosis Date Noted  . Carotid atherosclerosis 02/18/2016  . Cervical spine arthritis ([place]) 02/18/2016  . Neck pain, chronic 02/16/2016  . Eye dryness 02/16/2016  . Eye redness 02/16/2016  . Leukocytosis 12/19/2015  . Pelvic mass in female  12/02/2015  . Degenerative disc disease, lumbar 11/29/2015  . Aorto-iliac atherosclerosis ([place]) 11/29/2015  . Degenerative joint disease (DJD) of hip 11/29/2015  . Blurred vision, bilateral 11/22/2015  . Abdominal bloating 11/22/2015  . Low back pain 10/21/2015  . Pelvic pain in female 10/21/2015  . Osteoporosis 09/26/2015  . Shoulder pain, left 07/22/2015  . Cough 07/22/2015  . Hyponatremia 07/22/2015  . Hypertriglyceridemia 07/22/2015  . Hypochloremia 07/22/2015  . Type 2 diabetes mellitus with hyperglycemia ([place]) 07/06/2015  . Dyslipidemia 07/06/2015  . Essential hypertension, benign 07/06/2015  . Medication monitoring encounter 07/06/2015  . Chronic left flank pain 07/06/2015  . Arthritis of both knees 07/06/2015  . Psoriasis, guttate 07/06/2015  . Post-menopausal 07/06/2015  . Gait abnormality 07/06/2015    Past Medical History: Past Medical History  Diagnosis Date  . Hyperlipidemia   . Hypertension   . Diabetes mellitus without complication ([place])   . DJD (degenerative joint disease)   . History of ovarian cancer     she states approximately 60 years ago.    Past Surgical History: Past Surgical History  Procedure Laterality Date  . Appendectomy    . Abdominal hysterectomy      partial, due to ovarian cancer  . Cataract extraction      Family History: Family History  Problem Relation Age of Onset  . Family history unknown: Yes    Social History: Social History   Social History  . Marital Status: Widowed    Spouse Name: N/A  . Number of Children: N/A  . Years of Education: N/A   Occupational History  .  Not on file.   Social History Main Topics  . Smoking status: Never Smoker   . Smokeless tobacco: Never Used  . Alcohol Use: No  . Drug Use: No  . Sexual Activity: Not on file   Other Topics Concern  . Not on file   Social History Narrative    Allergies: No Known Allergies  Current Medications: Current Outpatient Prescriptions   Medication Sig Dispense Refill  . alendronate (FOSAMAX) 70 MG tablet Take 1 tablet (70 mg total) by mouth once a week. Take with a full glass of water on an empty stomach. 13 tablet 1  . aspirin EC 81 MG tablet Take 1 tablet (81 mg total) by mouth daily. 30 tablet 11  . Blood Glucose Monitoring Suppl (FIFTY50 GLUCOSE METER 2.0) W/DEVICE KIT Use as directed.    . clobetasol ointment (TEMOVATE) 9.97 % Apply 1 application topically 2 (two) times daily. 60 g 3  . diclofenac sodium (VOLTAREN) 1 % GEL Apply 2 g topically 4 (four) times daily. 100 g 2  . glipiZIDE (GLUCOTROL) 10 MG tablet Take 10 mg by mouth 2 (two) times daily.    Marland Kitchen glucose blood test strip     . losartan (COZAAR) 100 MG tablet Take 1 tablet (100 mg total) by mouth daily. 90 tablet 1  . metFORMIN (GLUCOPHAGE-XR) 500 MG 24 hr tablet Take 2 tablets (1,000 mg total) by mouth 2 (two) times daily. 360 tablet 1  . pantoprazole (PROTONIX) 40 MG tablet Take 1 tablet (40 mg total) by mouth daily. 90 tablet 1  . pravastatin (PRAVACHOL) 20 MG tablet Take 1 tablet (20 mg total) by mouth at bedtime. 90 tablet 1  . TRADJENTA 5 MG TABS tablet     . traMADol (ULTRAM) 50 MG tablet Take 1 tablet (50 mg total) by mouth every 6 (six) hours as needed. 30 tablet 0   No current facility-administered medications for this visit.    Review of Systems Per interval history.   Objective:  Physical Examination:  BP 152/81 mmHg  Pulse 105  Temp(Src) 98.2 F (36.8 C) (Oral)  Ht 5' 2.21" (1.58 m)  Wt 139 lb (63.05 kg)  BMI 25.26 kg/m2   ECOG Performance Status: 1 - Symptomatic but completely ambulatory  General appearance: alert, cooperative and appears stated age HEENT:PERRLA, neck supple with midline trachea and thyroid without masses Lymph node survey: non-palpable, axillary, inguinal, supraclavicular Cardiovascular: regular rate and rhythm, no murmurs or gallops Respiratory: normal air entry, lungs clear to auscultation Breast exam: not  examined. Abdomen: soft, non-tender, without masses or organomegaly, no hernias and well healed incision.  1 cm lipoma in left mid abdominal wall. Back: inspection of back is normal Extremities: extremities normal, atraumatic, no cyanosis or edema Skin exam - normal coloration and turgor, no rashes, no suspicious skin lesions noted. Neurological exam reveals alert, oriented, normal speech, no focal findings or movement disorder noted.  Pelvic: exam chaperoned by nurse;  Vulva: normal appearing vulva with no masses, tenderness or lesions; Vagina: normal vagina; Adnexa: normal adnexa in size, nontender and no masses; Rectal: no masses palpable    Assessment:  Tasha Rivera is a 82 y.o. female diagnosed with multicystic pelvic mass that has increased from 6.9 to 7.3 cm in 3 months.  This does not necessarily represent a change and can just represent variation in measurement between scans. And she is essentially asymptomatic.    Medical co-morbidities complicating care: advanced age.  Plan:   Problem List Items Addressed This Visit  Other   Pelvic mass in female - Primary      We discussed options for management including surgery versus expectant management.  We will get a CA125 and CEA as well as pelvic ultrasound for further characterization and to r/o ovarian cancer.  The patient would like to avoid surgery if at all possible.  Actually, she says she will not have surgery, but I still think it is worth doing the above tests to better quantify her risk of malignancy.   The patient's diagnosis, an outline of the further diagnostic and laboratory studies which will be required, the recommendation, and alternatives were discussed with the patient and her daughter with an interpreter.  All questions were answered to the patient's satisfaction.  A total of 40 minutes were spent with the patient/family today; 50% was spent in education, counseling and coordination of care for pelvic mass.     Mellody Drown, MD  CC:  Arnetha Courser, [address], [place] [address] [phone]

## 2016-03-15 NOTE — Progress Notes (Signed)
Patient here for referral for pelvic mass.

## 2016-03-16 LAB — CA 125: CA 125: 11.1 U/mL (ref 0.0–38.1)

## 2016-03-16 LAB — CEA: CEA: 1.7 ng/mL (ref 0.0–4.7)

## 2016-03-20 ENCOUNTER — Ambulatory Visit: Payer: Medicare Other

## 2016-03-20 DIAGNOSIS — M47812 Spondylosis without myelopathy or radiculopathy, cervical region: Secondary | ICD-10-CM | POA: Diagnosis not present

## 2016-04-03 ENCOUNTER — Encounter: Payer: Self-pay | Admitting: Family Medicine

## 2016-04-05 DIAGNOSIS — M542 Cervicalgia: Secondary | ICD-10-CM | POA: Diagnosis not present

## 2016-04-10 DIAGNOSIS — M542 Cervicalgia: Secondary | ICD-10-CM | POA: Diagnosis not present

## 2016-04-13 ENCOUNTER — Ambulatory Visit: Payer: Self-pay | Admitting: Family Medicine

## 2016-04-14 DIAGNOSIS — M542 Cervicalgia: Secondary | ICD-10-CM | POA: Diagnosis not present

## 2016-04-17 ENCOUNTER — Other Ambulatory Visit: Payer: Self-pay

## 2016-04-17 ENCOUNTER — Telehealth: Payer: Self-pay

## 2016-04-17 NOTE — Telephone Encounter (Signed)
She sees an endocrinologist for her diabetes Please ask pharmacist to send Rx to that provider Thank you

## 2016-04-17 NOTE — Telephone Encounter (Signed)
Pharmacy sent refill request on glucophage XR 500mg  2 bid? i dont see on current med list?

## 2016-04-18 DIAGNOSIS — H353131 Nonexudative age-related macular degeneration, bilateral, early dry stage: Secondary | ICD-10-CM | POA: Diagnosis not present

## 2016-04-18 LAB — HM DIABETES EYE EXAM

## 2016-04-18 NOTE — Telephone Encounter (Signed)
Patient sees an endocrinologist for her diabetes, so please ask them to forward all diabetes medicine and supply requests to her diabetes doctor; thank you

## 2016-04-18 NOTE — Telephone Encounter (Signed)
Daughter notified 

## 2016-04-21 ENCOUNTER — Other Ambulatory Visit: Payer: Self-pay | Admitting: Family Medicine

## 2016-04-21 NOTE — Telephone Encounter (Signed)
Patient sees endocrinologist; all diabetic medicine refills should go through that office please

## 2016-04-25 ENCOUNTER — Ambulatory Visit (INDEPENDENT_AMBULATORY_CARE_PROVIDER_SITE_OTHER): Payer: Medicare Other | Admitting: Family Medicine

## 2016-04-25 ENCOUNTER — Encounter: Payer: Self-pay | Admitting: Family Medicine

## 2016-04-25 VITALS — BP 122/68 | HR 85 | Temp 98.0°F | Resp 16 | Wt 141.0 lb

## 2016-04-25 DIAGNOSIS — L404 Guttate psoriasis: Secondary | ICD-10-CM | POA: Diagnosis not present

## 2016-04-25 DIAGNOSIS — M170 Bilateral primary osteoarthritis of knee: Secondary | ICD-10-CM

## 2016-04-25 DIAGNOSIS — I70299 Other atherosclerosis of native arteries of extremities, unspecified extremity: Secondary | ICD-10-CM | POA: Diagnosis not present

## 2016-04-25 DIAGNOSIS — I700 Atherosclerosis of aorta: Secondary | ICD-10-CM

## 2016-04-25 DIAGNOSIS — M129 Arthropathy, unspecified: Secondary | ICD-10-CM | POA: Diagnosis not present

## 2016-04-25 DIAGNOSIS — E1165 Type 2 diabetes mellitus with hyperglycemia: Secondary | ICD-10-CM | POA: Diagnosis not present

## 2016-04-25 NOTE — Patient Instructions (Addendum)
Please contact your diabetes doctor right away about your medicines It is not safe to double up on medicine Always take your medicines as prescribed  We'll have you see a skin doctor about the psoriasis  Try turmeric as a natural anti-inflammatory (for pain and arthritis). It comes in capsules where you buy aspirin and fish oil, but also as a spice where you buy pepper and garlic powder.  Avoid white bread, white potatoes, white rice, and sugar Sweet potatoes are okay Vegetables are okay Whole grain or whole wheat bread or buckwheat or soba noodles are good Brown rice or wild rice is better than white rice

## 2016-04-25 NOTE — Progress Notes (Signed)
BP 122/68   Pulse 85   Temp 98 F (36.7 C) (Oral)   Resp 16   Wt 141 lb (64 kg)   SpO2 95%   BMI 25.62 kg/m    Subjective:    Patient ID: Tasha Rivera, female    DOB: 1924-05-29, 82 y.o.   MRN: [id number]  HPI: Tasha NeasSang Sensabaugh is a 82 y.o. female  Chief Complaint  Patient presents with  . Medication Refill   Patient is here for f/u; since last visit, she had abscessed teeth which were extracted; she was eating something and then felt pain; went to dentist and they found pus and removed three teeth and the infection is better; they are going to fit her with dentures; two of them were implants and one was natural tooth; no fevers; doing better now  She has rash on the legs, arms; [place]; she is not putting Cong[place]inese medicine on her legs; taking Congohinese herbs and in the form of like a soup; no stomach upset; no easy bruising or bleeding; skin is thin though  Type 2 diabetes; seeing endocrinologist; she doubled up on one of her medicines because her sugars were going up  Going to see physical therapist tomorrow; finished with neck already; going to work on knees next  [facility] 2/9 04/25/2016 02/16/2016  Decreased Interest 0 0  Down, Depressed, Hopeless 0 0  PHQ - 2 Score 0 0   Relevant past medical, surgical, family and social history reviewed Past Medical History:  Diagnosis Date  . Carotid atherosclerosis 2017  . Diabetes mellitus without complication (HCC)   . DJD (degenerative joint disease)   . History of ovarian cancer    she states approximately 60 years ago.  Marland Kitchen. Hyperlipidemia   . Hypertension    Past Surgical History:  Procedure Laterality Date  . ABDOMINAL HYSTERECTOMY     partial, due to ovarian cancer  . APPENDECTOMY    . CATARACT EXTRACTION     Family History  Problem Relation Age of Onset  . Cancer Son 4540    stomach   Social History  Substance Use Topics  . Smoking status: Never Smoker  . Smokeless tobacco: Never Used    . Alcohol use No   Interim medical history since last visit reviewed. Allergies and medications reviewed  Review of Systems Per HPI unless specifically indicated above     Objective:    BP 122/68   Pulse 85   Temp 98 F (36.7 C) (Oral)   Resp 16   Wt 141 lb (64 kg)   SpO2 95%   BMI 25.62 kg/m   Wt Readings from Last 3 Encounters:  04/25/16 141 lb (64 kg)  03/15/16 139 lb (63 kg)  03/08/16 143 lb (64.9 kg)    Physical Exam  Constitutional: She appears well-developed and well-nourished.  HENT:  Head: Normocephalic and atraumatic.  Mouth/Throat: Mucous membranes are normal.  Eyes: EOM are normal. No scleral icterus.  Cardiovascular: Normal rate and regular rhythm.   Pulmonary/Chest: Effort normal and breath sounds normal.  Neurological: She is alert.  Skin: Skin is warm. Rash (too numerous to count guttate psoriatic lesions on limbs) noted. No pallor.  Psychiatric: She has a normal mood and affect. Her mood appears not anxious. She does not exhibit a depressed mood.   Results for orders placed or performed in visit on 04/21/16  HM DIABETES EYE EXAM  Result Value Ref Range   HM Diabetic  Eye Exam No Retinopathy No Retinopathy      Assessment & Plan:   Problem List Items Addressed This Visit      Musculoskeletal and Integument   Psoriasis, guttate - Primary    Refer to dermatologist      Relevant Orders   Ambulatory referral to Dermatology   Arthritis of both knees    See PT about OA in the knees; follow-up with orthopaedist; try turmeric as natural anti-inflammatory        Other   Type 2 diabetes mellitus with hyperglycemia (HCC)    Encouraged patient to never double up on medicine, but to always contact her endocrinologist about her diabetes medicine; interpreter will help them do that today; dietary recommendations given, see AVS       Other Visit Diagnoses   None.     Follow up plan: Return if symptoms worsen or fail to improve.  An after-visit  summary was printed and given to the patient at check-out.  Please see the patient instructions which may contain other information and recommendations beyond what is mentioned above in the assessment and plan.   Orders Placed This Encounter  Procedures  . Ambulatory referral to Dermatology

## 2016-04-25 NOTE — Assessment & Plan Note (Signed)
Refer to dermatologist 

## 2016-04-26 DIAGNOSIS — G8929 Other chronic pain: Secondary | ICD-10-CM | POA: Diagnosis not present

## 2016-04-26 DIAGNOSIS — M545 Low back pain: Secondary | ICD-10-CM | POA: Diagnosis not present

## 2016-05-02 ENCOUNTER — Other Ambulatory Visit: Payer: Self-pay | Admitting: Family Medicine

## 2016-05-03 NOTE — Telephone Encounter (Signed)
This medicine should come from her endocrinologist; denied here

## 2016-05-04 DIAGNOSIS — M545 Low back pain: Secondary | ICD-10-CM | POA: Diagnosis not present

## 2016-05-04 DIAGNOSIS — G8929 Other chronic pain: Secondary | ICD-10-CM | POA: Diagnosis not present

## 2016-05-05 ENCOUNTER — Other Ambulatory Visit: Payer: Self-pay | Admitting: Family Medicine

## 2016-05-06 NOTE — Telephone Encounter (Signed)
Tasha JacobsonHelen, please call pharmacy and ask them to please quit sending us refill requests for this medicine Patient sees an endocrinologist for her diabetes All of her diabetes medicine requests and any requests for supplies need to go to the diabetes doctor Thank you

## 2016-05-12 DIAGNOSIS — G8929 Other chronic pain: Secondary | ICD-10-CM | POA: Diagnosis not present

## 2016-05-12 DIAGNOSIS — M545 Low back pain: Secondary | ICD-10-CM | POA: Diagnosis not present

## 2016-05-17 DIAGNOSIS — R21 Rash and other nonspecific skin eruption: Secondary | ICD-10-CM | POA: Diagnosis not present

## 2016-05-17 DIAGNOSIS — L408 Other psoriasis: Secondary | ICD-10-CM | POA: Diagnosis not present

## 2016-05-25 DIAGNOSIS — E1142 Type 2 diabetes mellitus with diabetic polyneuropathy: Secondary | ICD-10-CM | POA: Diagnosis not present

## 2016-05-28 NOTE — Assessment & Plan Note (Signed)
Encouraged patient to never double up on medicine, but to always contact her endocrinologist about her diabetes medicine; interpreter will help them do that today; dietary recommendations given, see AVS

## 2016-05-28 NOTE — Assessment & Plan Note (Signed)
See PT about OA in the knees; follow-up with orthopaedist; try turmeric as natural anti-inflammatory

## 2016-06-01 ENCOUNTER — Telehealth: Payer: Self-pay | Admitting: Family Medicine

## 2016-06-01 DIAGNOSIS — E118 Type 2 diabetes mellitus with unspecified complications: Secondary | ICD-10-CM | POA: Diagnosis not present

## 2016-06-01 DIAGNOSIS — Z79899 Other long term (current) drug therapy: Secondary | ICD-10-CM | POA: Diagnosis not present

## 2016-06-01 DIAGNOSIS — E1165 Type 2 diabetes mellitus with hyperglycemia: Secondary | ICD-10-CM | POA: Diagnosis not present

## 2016-06-02 MED ORDER — CLOBETASOL PROPIONATE 0.05 % EX OINT: 1.0000 "application " | TOPICAL_OINTMENT | Freq: Two times a day (BID) | CUTANEOUS | 3 refills | Status: DC

## 2016-06-02 MED ORDER — DICLOFENAC SODIUM 1 % TD GEL: 2.0000 g | Freq: Four times a day (QID) | TRANSDERMAL | 5 refills | Status: DC

## 2016-06-02 NOTE — Telephone Encounter (Signed)
rx sent Pharmacy udpated

## 2016-06-09 DIAGNOSIS — M545 Low back pain: Secondary | ICD-10-CM | POA: Diagnosis not present

## 2016-06-09 DIAGNOSIS — G8929 Other chronic pain: Secondary | ICD-10-CM | POA: Diagnosis not present

## 2016-06-29 ENCOUNTER — Ambulatory Visit: Payer: Self-pay | Admitting: Family Medicine

## 2016-07-19 DIAGNOSIS — L408 Other psoriasis: Secondary | ICD-10-CM | POA: Diagnosis not present

## 2016-07-21 ENCOUNTER — Encounter: Payer: Self-pay | Admitting: Family Medicine

## 2016-07-21 ENCOUNTER — Emergency Department: Payer: Medicare Other

## 2016-07-21 ENCOUNTER — Emergency Department
Admission: EM | Admit: 2016-07-21 | Discharge: 2016-07-21 | Disposition: A | Discharge status: Home / Self Care | Payer: Medicare Other | Attending: Emergency Medicine | Admitting: Emergency Medicine

## 2016-07-21 ENCOUNTER — Ambulatory Visit (INDEPENDENT_AMBULATORY_CARE_PROVIDER_SITE_OTHER): Payer: Medicare Other | Admitting: Family Medicine

## 2016-07-21 ENCOUNTER — Encounter: Payer: Self-pay | Admitting: Emergency Medicine

## 2016-07-21 VITALS — BP 138/66 | HR 83 | Temp 98.1°F | Wt 140.7 lb

## 2016-07-21 DIAGNOSIS — Z8543 Personal history of malignant neoplasm of ovary: Secondary | ICD-10-CM | POA: Insufficient documentation

## 2016-07-21 DIAGNOSIS — Z79899 Other long term (current) drug therapy: Secondary | ICD-10-CM | POA: Insufficient documentation

## 2016-07-21 DIAGNOSIS — R079 Chest pain, unspecified: Secondary | ICD-10-CM | POA: Diagnosis not present

## 2016-07-21 DIAGNOSIS — I700 Atherosclerosis of aorta: Secondary | ICD-10-CM | POA: Diagnosis not present

## 2016-07-21 DIAGNOSIS — K219 Gastro-esophageal reflux disease without esophagitis: Secondary | ICD-10-CM | POA: Diagnosis not present

## 2016-07-21 DIAGNOSIS — I70299 Other atherosclerosis of native arteries of extremities, unspecified extremity: Secondary | ICD-10-CM | POA: Diagnosis not present

## 2016-07-21 DIAGNOSIS — E119 Type 2 diabetes mellitus without complications: Secondary | ICD-10-CM | POA: Insufficient documentation

## 2016-07-21 DIAGNOSIS — Z7982 Long term (current) use of aspirin: Secondary | ICD-10-CM | POA: Insufficient documentation

## 2016-07-21 DIAGNOSIS — I10 Essential (primary) hypertension: Secondary | ICD-10-CM | POA: Diagnosis not present

## 2016-07-21 DIAGNOSIS — I708 Atherosclerosis of other arteries: Secondary | ICD-10-CM

## 2016-07-21 DIAGNOSIS — R0789 Other chest pain: Secondary | ICD-10-CM | POA: Diagnosis not present

## 2016-07-21 DIAGNOSIS — G5601 Carpal tunnel syndrome, right upper limb: Secondary | ICD-10-CM | POA: Diagnosis not present

## 2016-07-21 DIAGNOSIS — I6523 Occlusion and stenosis of bilateral carotid arteries: Secondary | ICD-10-CM

## 2016-07-21 LAB — BASIC METABOLIC PANEL
ANION GAP: 11 (ref 5–15)
BUN: 18 mg/dL (ref 6–20)
CO2: 21 mmol/L — ABNORMAL LOW (ref 22–32)
Calcium: 9.4 mg/dL (ref 8.9–10.3)
Chloride: 103 mmol/L (ref 101–111)
Creatinine, Ser: 0.74 mg/dL (ref 0.44–1.00)
Glucose, Bld: 231 mg/dL — ABNORMAL HIGH (ref 65–99)
POTASSIUM: 4.3 mmol/L (ref 3.5–5.1)
Sodium: 135 mmol/L (ref 135–145)

## 2016-07-21 LAB — CBC
HEMATOCRIT: 34.7 % — AB (ref 35.0–47.0)
Hemoglobin: 12.2 g/dL (ref 12.0–16.0)
MCH: 30.8 pg (ref 26.0–34.0)
MCHC: 35.2 g/dL (ref 32.0–36.0)
MCV: 87.7 fL (ref 80.0–100.0)
Platelets: 212 10*3/uL (ref 150–440)
RBC: 3.95 MIL/uL (ref 3.80–5.20)
RDW: 12.3 % (ref 11.5–14.5)
WBC: 9.2 10*3/uL (ref 3.6–11.0)

## 2016-07-21 LAB — TROPONIN I: Troponin I: <0.03 ng/mL (ref <0.03)

## 2016-07-21 NOTE — ED Provider Notes (Signed)
[facility] Emergency Department Provider Note ____________________________________________   I have reviewed the triage vital signs and the triage nursing note.  HISTORY  Chief Complaint Chest Pain   Historian Patient through interpreter she brought with her  HPI Tasha Rivera is a 82 y.o. female reports she was sent here from her primary care doctor's office because she noted that she has had several months of intermittent left-sided chest discomfort. She had previously been diagnosed with indigestion and has been off her medications for that. She gets burning after she eats especially spicy foods she reports pain that occurs in the midepigastrium and over to the left side of her chest. No sweats or nausea or fevers. She states that occasionally she has a dry cough. No vomiting. No black or bloody stools and does not report history of seeing a cardiologist in the past.    Past Medical History:  Diagnosis Date  . Carotid atherosclerosis 2017  . Diabetes mellitus without complication ([place])   . DJD (degenerative joint disease)   . History of ovarian cancer    she states approximately 60 years ago.  Marland Kitchen Hyperlipidemia   . Hypertension     Patient Active Problem List   Diagnosis Date Noted  . Carpal tunnel syndrome on right 07/21/2016  . Carotid atherosclerosis 02/18/2016  . Cervical spine arthritis ([place]) 02/18/2016  . Neck pain, chronic 02/16/2016  . Eye dryness 02/16/2016  . Eye redness 02/16/2016  . Leukocytosis 12/19/2015  . Pelvic mass in female 12/02/2015  . Degenerative disc disease, lumbar 11/29/2015  . Aorto-iliac atherosclerosis ([place]) 11/29/2015  . Degenerative joint disease (DJD) of hip 11/29/2015  . Blurred vision, bilateral 11/22/2015  . Abdominal bloating 11/22/2015  . Low back pain 10/21/2015  . Pelvic pain in female 10/21/2015  . Osteoporosis 09/26/2015  . Shoulder pain, left 07/22/2015  . Cough 07/22/2015  . Hyponatremia 07/22/2015  .  Hypertriglyceridemia 07/22/2015  . Hypochloremia 07/22/2015  . Type 2 diabetes mellitus with hyperglycemia ([place]) 07/06/2015  . Dyslipidemia 07/06/2015  . Essential hypertension, benign 07/06/2015  . Medication monitoring encounter 07/06/2015  . Chronic left flank pain 07/06/2015  . Arthritis of both knees 07/06/2015  . Psoriasis, guttate 07/06/2015  . Post-menopausal 07/06/2015  . Gait abnormality 07/06/2015    Past Surgical History:  Procedure Laterality Date  . ABDOMINAL HYSTERECTOMY     partial, due to ovarian cancer  . APPENDECTOMY    . CATARACT EXTRACTION      Prior to Admission medications   Medication Sig Start Date End Date Taking? Authorizing Provider  alendronate (FOSAMAX) 70 MG tablet Take 1 tablet (70 mg total) by mouth once a week. Take with a full glass of water on an empty stomach. 02/16/16   Arnetha Courser, MD  aspirin EC 81 MG tablet Take 1 tablet (81 mg total) by mouth daily. 03/08/16   Arnetha Courser, MD  Blood Glucose Monitoring Suppl (FIFTY50 GLUCOSE METER 2.0) W/DEVICE KIT Use as directed. 08/30/15 08/29/16  Historical Provider, MD  clobetasol ointment (TEMOVATE) 3.54 % Apply 1 application topically 2 (two) times daily. 06/02/16   Arnetha Courser, MD  diclofenac sodium (VOLTAREN) 1 % GEL Apply 2 g topically 4 (four) times daily. 06/02/16   Arnetha Courser, MD  glipiZIDE (GLUCOTROL) 10 MG tablet Take 10 mg by mouth 2 (two) times daily. 10/20/15 10/19/16  Historical Provider, MD  glucose blood test strip  08/30/15 08/29/16  Historical Provider, MD  losartan (COZAAR) 100 MG tablet Take 1  tablet (100 mg total) by mouth daily. 02/16/16   Arnetha Courser, MD  metFORMIN (GLUCOPHAGE-XR) 500 MG 24 hr tablet Take 2 tablets (1,000 mg total) by mouth 2 (two) times daily. 08/14/15   Arnetha Courser, MD  pantoprazole (PROTONIX) 40 MG tablet Take 1 tablet (40 mg total) by mouth daily. 02/16/16   Arnetha Courser, MD  pravastatin (PRAVACHOL) 20 MG tablet Take 1 tablet (20 mg total) by mouth  at bedtime. 02/16/16   Arnetha Courser, MD  TRADJENTA 5 MG TABS tablet  01/27/16   Historical Provider, MD    No Known Allergies  Family History  Problem Relation Age of Onset  . Cancer Son 72    stomach    Social History Social History  Substance Use Topics  . Smoking status: Never Smoker  . Smokeless tobacco: Never Used  . Alcohol use No    Review of Systems  Constitutional: Negative for fever. Eyes: Negative for visual changes. ENT: Negative for sore throat. Cardiovascular: chest pain as per history of present illness Respiratory: Negative for shortness of breath. Gastrointestinal: Negative for abdominal pain, vomiting and diarrhea. Genitourinary: Negative for dysuria. Musculoskeletal: Negative for back pain. Skin: Negative for rash. Neurological: Negative for headache. 10 point Review of Systems otherwise negative ____________________________________________   PHYSICAL EXAM:  VITAL SIGNS: ED Triage Vitals  Enc Vitals Group     BP 07/21/16 1457 (!) 181/73     Pulse Rate 07/21/16 1457 81     Resp --      Temp 07/21/16 1457 98.2 F (36.8 C)     Temp Source 07/21/16 1457 Oral     SpO2 07/21/16 1457 97 %     Weight 07/21/16 1452 140 lb (63.5 kg)     Height 07/21/16 1452 5' 3" (1.6 m)     Head Circumference --      Peak Flow --      Pain Score 07/21/16 1453 6     Pain Loc --      Pain Edu? --      Excl. in [place]? --      Constitutional: Alert and oriented. Well appearing and in no distress. HEENT   Head: Normocephalic and atraumatic.      Eyes: Conjunctivae are normal. PERRL. Normal extraocular movements.      Ears:         Nose: No congestion/rhinnorhea.   Mouth/Throat: Mucous membranes are moist.   Neck: No stridor. Cardiovascular/Chest: Normal rate, regular rhythm.  No murmurs, rubs, or gallops. Respiratory: Normal respiratory effort without tachypnea nor retractions. Breath sounds are clear and equal bilaterally. No  wheezes/rales/rhonchi. Gastrointestinal: Soft. No distention, no guarding, no rebound. Very mild discomfort in the epigastrium.  Genitourinary/rectal:Deferred Musculoskeletal: Nontender with normal range of motion in all extremities. No joint effusions.  No lower extremity tenderness.  No edema. Neurologic:  Normal speech and language. No gross or focal neurologic deficits are appreciated. Skin:  Skin is warm, dry and intact. No rash noted. Psychiatric: Mood and affect are normal. Speech and behavior are normal. Patient exhibits appropriate insight and judgment.   ____________________________________________  LABS (pertinent positives/negatives)  Labs Reviewed  BASIC METABOLIC PANEL - Abnormal; Notable for the following:       Result Value   CO2 21 (*)    Glucose, Bld 231 (*)    All other components within normal limits  CBC - Abnormal; Notable for the following:    HCT 34.7 (*)    All other  components within normal limits  TROPONIN I    ____________________________________________    EKG I, Lisa Roca, MD, the attending physician have personally viewed and interpreted all ECGs.  83 bpm. Normal sinus rhythm. Narrow QRS. Normal axis. Normal ST and T-wave ____________________________________________  RADIOLOGY All Xrays were viewed by me. Imaging interpreted by Radiologist.  Chest two-view:IMPRESSION: Chronic bronchitic changes with probable superimposed acute bronchitis. No alveolar pneumonia nor CHF.  Aortic atherosclerosis. __________________________________________  PROCEDURES  Procedure(s) performed: None  Critical Care performed: None  ____________________________________________   ED COURSE / ASSESSMENT AND PLAN  Pertinent labs & imaging results that were available during my care of the patient were reviewed by me and considered in my medical decision making (see chart for details).   Ms. Pearlie Oyster was brought in on referral from a prima care  physician for intermittent chest pains. Clinically it sounds like indigestion type symptoms. Her EKG is reassuring. Her laboratory exam and x-ray are reassuring. Her clinical physical exam is reassuring. I discussed with her that I still would recommend that she felt up with a cardiologist, but at this point in time I'm most suspicious of indigestion. We discussed dietary changes as well as over-the-counter remedies including Maalox and Prilosec.     CONSULTATIONS:   None   Patient / Family / Caregiver informed of clinical course, medical decision-making process, and agree with plan.   I discussed return precautions, follow-up instructions, and discharge instructions with patient and/or family.   ___________________________________________   FINAL CLINICAL IMPRESSION(S) / ED DIAGNOSES   Final diagnoses:  Atypical chest pain  Gastroesophageal reflux disease, esophagitis presence not specified              Note: This dictation was prepared with Dragon dictation. Any transcriptional errors that result from this process are unintentional    Lisa Roca, MD 07/21/16 2025

## 2016-07-21 NOTE — Discharge Instructions (Signed)
You were evaluated for chest discomfort and although no certain cause was on call your exam and evaluation are reassuring in the emergency department today. Based on what we discussed, sounds like you're probably having symptoms of indigestion/acid reflux.  However given your age, and nonspecific chest discomfort, I would recommend you follow-up with a cardiologist, [facility] as above on Monday for an appointment early next week.  Return to the emergency room for any worsening chest pain, sweats, trouble breathing, fever, vomiting blood, black or bloody stools, or any other symptoms concerning to you.

## 2016-07-21 NOTE — Progress Notes (Signed)
BP 138/66   Pulse 83   Temp 98.1 F (36.7 C) (Oral)   Wt 140 lb 11.2 oz (63.8 kg)   SpO2 96%   BMI 25.57 kg/m    Subjective:    Patient ID: Tasha Rivera, female    DOB: Oct 20, 1924, 82 y.o.   MRN: [id number]  HPI: Tasha Rivera is a 82 y.o. female  Chief Complaint  Patient presents with  . Diabetes   Patient is here with her daughter and a Korean-speaking interpreter She has pain in her chest; interpreter tries to relay that maybe it's heartburn, acid reflux in her chest; feels pressure from stomach; taking pantoprazole Then patient puts her hand over heart (modified Levine sign), left of midline breast; two weeks duration and comes and goes; lasts 20 to 30 minutes at a time, not modified by food intake I tried to ask about pain right now: "not much" per interpreter but present No lumps in the breast She has a mass in the abdomen/pelvis but they are not going to do anything about that Tingling in right hand, arthritis; massage helps; right-handed She is actually seeing an endocrinologist for her diabetes  Depression screen [facility] 2/9 07/21/2016 04/25/2016 02/16/2016  Decreased Interest 0 0 0  Down, Depressed, Hopeless 1 0 0  PHQ - 2 Score 1 0 0   Relevant past medical, surgical, family and social history reviewed Past Medical History:  Diagnosis Date  . Carotid atherosclerosis 2017  . Diabetes mellitus without complication (HCC)   . DJD (degenerative joint disease)   . History of ovarian cancer    she states approximately 60 years ago.  Marland Kitchen Hyperlipidemia   . Hypertension    Past Surgical History:  Procedure Laterality Date  . ABDOMINAL HYSTERECTOMY     partial, due to ovarian cancer  . APPENDECTOMY    . CATARACT EXTRACTION     Family History  Problem Relation Age of Onset  . Cancer Son 32    stomach   Social History  Substance Use Topics  . Smoking status: Never Smoker  . Smokeless tobacco: Never Used  . Alcohol use No   Interim medical history since last visit  reviewed. Allergies and medications reviewed  Review of Systems Per HPI unless specifically indicated above     Objective:    BP 138/66   Pulse 83   Temp 98.1 F (36.7 C) (Oral)   Wt 140 lb 11.2 oz (63.8 kg)   SpO2 96%   BMI 25.57 kg/m   Wt Readings from Last 3 Encounters:  07/21/16 140 lb (63.5 kg)  07/21/16 140 lb 11.2 oz (63.8 kg)  04/25/16 141 lb (64 kg)    Physical Exam  Constitutional: She appears well-developed and well-nourished. No distress.  Cardiovascular: Normal rate and regular rhythm.   Pulmonary/Chest: Effort normal and breath sounds normal. She has no wheezes.  Abdominal: She exhibits distension (full but soft).  Neurological: She is alert.  Psychiatric: She has a normal mood and affect.    Results for orders placed or performed in visit on 04/21/16  HM DIABETES EYE EXAM  Result Value Ref Range   HM Diabetic Eye Exam No Retinopathy No Retinopathy      Assessment & Plan:   Problem List Items Addressed This Visit      Cardiovascular and Mediastinum   Carotid atherosclerosis    On statin and aspirin; presence of carotid athero increases likelihood that she has CAD      Aorto-iliac atherosclerosis (HCC)  On statin, aspirin; makes likelihood of CAD higher        Nervous and Auditory   Carpal tunnel syndrome on right     Other   Chest pain at rest - Primary    Patient reports episodes over the last two weeks of chest pain lasting 20-30 minutes, not changed by food; on PPI; she is 82 yo with diabetes, carotid atherosclerosis, and very likely has cardiac atherosclerosis; I am worried that she has crescendo, unstable angina; I talked her into going to the ER to be evaluated; explained that she may be admitted, they may opt to do stress test; they may discuss cath, but reminded her that she is in charge and does not have to do anything or have any procedures that she does not want; she agrees; daughter declined calling ems, opted to take her by private  vehicle; she had already had aspirin today       Other Visit Diagnoses   None.     Follow up plan: No Follow-up on file. --> f/u based on ER/hospitalization recommendation  An after-visit summary was printed and given to the patient at check-out.  Please see the patient instructions which may contain other information and recommendations beyond what is mentioned above in the assessment and plan.  Meds ordered this encounter  Medications  . pantoprazole (PROTONIX) 40 MG tablet    Sig: Take 1 tablet (40 mg total) by mouth daily.    Dispense:  90 tablet    Refill:  0   No orders of the defined types were placed in this encounter. Face-to-face time with patient was more than 25 minutes, >50% time spent counseling and coordination of care Addition time needed because of interpretation

## 2016-07-21 NOTE — ED Triage Notes (Signed)
Pt presents with family member and interpreter. Per interpreter, the chest pain began two weeks ago on the left side. Interpreter reports that pressing on the left side of her chest minimally relieves the chest pain.

## 2016-07-26 DIAGNOSIS — R079 Chest pain, unspecified: Secondary | ICD-10-CM | POA: Insufficient documentation

## 2016-07-26 MED ORDER — PANTOPRAZOLE SODIUM 40 MG PO TBEC: 40.0000 mg | DELAYED_RELEASE_TABLET | Freq: Every day | ORAL | 0 refills | Status: DC

## 2016-07-26 NOTE — Assessment & Plan Note (Signed)
On statin and aspirin; presence of carotid athero increases likelihood that she has CAD

## 2016-07-26 NOTE — Assessment & Plan Note (Signed)
On statin, aspirin; makes likelihood of CAD higher

## 2016-07-26 NOTE — Assessment & Plan Note (Signed)
Patient reports episodes over the last two weeks of chest pain lasting 20-30 minutes, not changed by food; on PPI; she is 82 yo with diabetes, carotid atherosclerosis, and very likely has cardiac atherosclerosis; I am worried that she has crescendo, unstable angina; I talked her into going to the ER to be evaluated; explained that she may be admitted, they may opt to do stress test; they may discuss cath, but reminded her that she is in charge and does not have to do anything or have any procedures that she does not want; she agrees; daughter declined calling ems, opted to take her by private vehicle; she had already had aspirin today

## 2016-07-31 DIAGNOSIS — R0789 Other chest pain: Secondary | ICD-10-CM | POA: Diagnosis not present

## 2016-09-05 DIAGNOSIS — E118 Type 2 diabetes mellitus with unspecified complications: Secondary | ICD-10-CM | POA: Diagnosis not present

## 2016-09-05 DIAGNOSIS — E1165 Type 2 diabetes mellitus with hyperglycemia: Secondary | ICD-10-CM | POA: Diagnosis not present

## 2016-09-12 DIAGNOSIS — E118 Type 2 diabetes mellitus with unspecified complications: Secondary | ICD-10-CM | POA: Diagnosis not present

## 2016-09-12 DIAGNOSIS — Z79899 Other long term (current) drug therapy: Secondary | ICD-10-CM | POA: Diagnosis not present

## 2016-09-12 DIAGNOSIS — I10 Essential (primary) hypertension: Secondary | ICD-10-CM | POA: Diagnosis not present

## 2016-09-12 DIAGNOSIS — E1165 Type 2 diabetes mellitus with hyperglycemia: Secondary | ICD-10-CM | POA: Diagnosis not present

## 2016-09-12 DIAGNOSIS — E782 Mixed hyperlipidemia: Secondary | ICD-10-CM | POA: Diagnosis not present

## 2016-10-10 ENCOUNTER — Ambulatory Visit: Payer: Self-pay | Admitting: Family Medicine

## 2016-10-17 ENCOUNTER — Ambulatory Visit: Payer: Self-pay | Admitting: Family Medicine

## 2016-10-31 ENCOUNTER — Ambulatory Visit
Admission: RE | Admit: 2016-10-31 | Discharge: 2016-10-31 | Disposition: A | Discharge status: Home / Self Care | Payer: Medicare Other | Source: Ambulatory Visit | Attending: Family Medicine | Admitting: Family Medicine

## 2016-10-31 ENCOUNTER — Ambulatory Visit (INDEPENDENT_AMBULATORY_CARE_PROVIDER_SITE_OTHER): Payer: Medicare Other | Admitting: Family Medicine

## 2016-10-31 ENCOUNTER — Encounter: Payer: Self-pay | Admitting: Family Medicine

## 2016-10-31 DIAGNOSIS — R1900 Intra-abdominal and pelvic swelling, mass and lump, unspecified site: Secondary | ICD-10-CM

## 2016-10-31 DIAGNOSIS — I6523 Occlusion and stenosis of bilateral carotid arteries: Secondary | ICD-10-CM | POA: Insufficient documentation

## 2016-10-31 DIAGNOSIS — K829 Disease of gallbladder, unspecified: Secondary | ICD-10-CM | POA: Insufficient documentation

## 2016-10-31 DIAGNOSIS — R1012 Left upper quadrant pain: Secondary | ICD-10-CM

## 2016-10-31 DIAGNOSIS — I10 Essential (primary) hypertension: Secondary | ICD-10-CM

## 2016-10-31 DIAGNOSIS — R197 Diarrhea, unspecified: Secondary | ICD-10-CM | POA: Diagnosis present

## 2016-10-31 DIAGNOSIS — E1165 Type 2 diabetes mellitus with hyperglycemia: Secondary | ICD-10-CM | POA: Diagnosis not present

## 2016-10-31 DIAGNOSIS — I700 Atherosclerosis of aorta: Secondary | ICD-10-CM | POA: Diagnosis not present

## 2016-10-31 DIAGNOSIS — R109 Unspecified abdominal pain: Secondary | ICD-10-CM | POA: Insufficient documentation

## 2016-10-31 DIAGNOSIS — I70299 Other atherosclerosis of native arteries of extremities, unspecified extremity: Secondary | ICD-10-CM | POA: Diagnosis not present

## 2016-10-31 DIAGNOSIS — R1907 Generalized intra-abdominal and pelvic swelling, mass and lump: Secondary | ICD-10-CM | POA: Diagnosis not present

## 2016-10-31 NOTE — Assessment & Plan Note (Signed)
Foot exam by MD today; seeing endocrinologist for management

## 2016-10-31 NOTE — Patient Instructions (Addendum)
Please go across the street for xrays We'll get an ultrasound this week Do please contact the cancer center to make an appointment very soon, as I suspect the place in your pelvis is getting bigger and causing your diarrhea and abdominal pain Stay off of the alendronate (osteoporosis medicine) We'll get a scan of your neck to look at the blockages If you ever develop signs or symptoms or stroke, call 911   Ischemic Stroke An ischemic stroke is the sudden death of brain tissue. Blood carries oxygen to all areas of the body. This type of stroke happens when your blood does not flow to your brain like normal. Your brain cannot get the oxygen it needs. This is an emergency. It must be treated right away. Symptoms of a stroke usually happen all of a sudden. You may notice them when you wake up. They can include:  Weakness or loss of feeling in your face, arm, or leg. This often happens on one side of the body.  Trouble walking.  Trouble moving your arms or legs.  Loss of balance or coordination.  Feeling confused.  Trouble talking or understanding what people are saying.  Slurred speech.  Trouble seeing.  Seeing two of one object (double vision).  Feeling dizzy.  Feeling sick to your stomach (nauseous) and throwing up (vomiting).  A very bad headache for no reason. Get help as soon as any of these problems start. This is important. Some treatments work better if they are given right away. These include:  Aspirin.  Medicines to control blood pressure.  A shot (injection) of medicine to break up the blood clot.  Treatments given in the blood vessel (artery) to take out the clot or break it up. Other treatments may include:  Oxygen.  Fluids given through an IV tube.  Medicines to thin out your blood.  Procedures to help your blood flow better. What increases the risk? Certain things may make you more likely to have a stroke. Some of these are things that you can change,  such as:  Being very overweight (obesity).  Smoking.  Taking birth control pills.  Not being active.  Drinking too much alcohol.  Using drugs. Other risk factors include:  High blood pressure.  High cholesterol.  Diabetes.  Heart disease.  Being [place], Native [address], Hispanic, or [place] Native.  Being over age 80.  Family history of stroke.  Having had blood clots, stroke, or warning stroke (transient ischemic attack, TIA) in the past.  Sickle cell disease.  Being a woman with a history of high blood pressure in pregnancy (preeclampsia).  Migraine headache.  Sleep apnea.  Having an irregular heartbeat (atrial fibrillation).  Long-term (chronic) diseases that cause soreness and swelling (inflammation).  Disorders that affect how your blood clots. Follow these instructions at home: Medicines  Take over-the-counter and prescription medicines only as told by your doctor.  If you were told to take aspirin or another medicine to thin your blood, take it exactly as told by your doctor.  Taking too much of the medicine can cause bleeding.  If you do not take enough, it may not work as well.  Know the side effects of your medicines. If you are taking a blood thinner, make sure you:  Hold pressure over any cuts for longer than usual.  Tell your dentist and other doctors that you take this medicine.  Avoid activities that may cause damage or injury to your body. Eating and drinking  Follow instructions  from your doctor about what you cannot eat or drink.  Eat healthy foods.  If you have trouble with swallowing, do these things to avoid choking:  Take small bites when eating.  Eat foods that are soft or pureed. Safety  Follow instructions from your health care team about physical activity.  Use a walker or cane as told by your doctor.  Keep your home safe so you do not fall. This may include:  Having experts look at your home to make  sure it is safe.  Putting grab bars in the bedroom and bathroom.  Using raised toilets.  Putting a seat in the shower. General instructions  Do not use any tobacco products.  Examples of these are cigarettes, chewing tobacco, and e-cigarettes.  If you need help quitting, ask your doctor.  Limit how much alcohol you drink. This means no more than 1 drink a day for nonpregnant women and 2 drinks a day for men. One drink equals 12 oz of beer, 5 oz of wine, or 1 oz of hard liquor.  If you need help to stop using drugs or alcohol, ask your doctor to refer you to a program or specialist.  Stay active. Exercise as told by your doctor.  Keep all follow-up visits as told by your doctor. This is important. Get help right away if:  You suddenly:  Have weakness or loss of feeling in your face, arm, or leg.  Feel confused.  Have trouble talking or understanding what people are saying.  Have trouble seeing.  Have trouble walking.  Have trouble moving your arms or legs.  Feel dizzy.  Lose your balance or coordination.  Have a very bad headache and you do not know why.  You pass out (lose consciousness) or almost pass out.  You have jerky movements that you cannot control (seizure). These symptoms may be an emergency. Do not wait to see if the symptoms will go away. Get medical help right away. Call your local emergency services (911 in the [place] Do not drive yourself to the hospital.  This information is not intended to replace advice given to you by your health care provider. Make sure you discuss any questions you have with your health care provider. Document Released: 10/12/2011 Document Revised: 04/04/2016 Document Reviewed: 01/19/2016 Elsevier Interactive Patient Education  2017 [employer]

## 2016-10-31 NOTE — Assessment & Plan Note (Addendum)
With symptoms of partial obstruction now; will get AAS and then refer for US; suspect mass pressing against colon, resulting in diarrhea (watery material getting past narrowed area); if worse, go to ER

## 2016-10-31 NOTE — Assessment & Plan Note (Signed)
Re-order carotid US; discussed risk of stroke

## 2016-10-31 NOTE — Assessment & Plan Note (Signed)
Excellent control.   

## 2016-10-31 NOTE — Progress Notes (Signed)
BP 130/66   Pulse 88   Temp 98.7 F (37.1 C)   Resp 16   Wt 137 lb 9 oz (62.4 kg)   SpO2 90%   BMI 24.37 kg/m    Subjective:    Patient ID: Tasha Rivera, female    DOB: 04-Mar-1924, 82 y.o.   MRN: [id number]  HPI: Tasha NeasSang Rami is a 82 y.o. female  Chief Complaint  Patient presents with  . Abdominal Pain    Morning diarrhea very watery, usually after breakfeast    Patient is here with Korean-speaking interpreter and her daughter She is very constipated and then also has watery diarrhea in the mornings She has had some abdominal pain as well; left side only Going on for two months Morning is actually okay when she wakes up; then after eating breakfast, it becomes watery diarrhea; foods just go right through her No blood in the diarrhea or stool Does have abdominal pain before the diarrhea starts, then the pain gets better No fevers Appetite is good Dr. 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 Johnnette LitterBerchuck; apparently patient never got the studies done  Last CT scan abd/pelvis from April 2017 reviewed: IMPRESSION: 1. Cystic mass in the pelvis has mildly increased in size, now measuring 7.3 x 5.2 x 6.8 cm, previously 6.9 x 5.0 x 6.4 cm. There is new dystrophic appearing calcification along its inferior margin. A cystic neoplasm is suspected given the mild interval growth. This still could reflect a paraovarian cyst or inclusion cyst. 2. There is no CT evidence of locally invasive carcinoma or of metastatic disease. 3. No acute findings. 4. Hepatic steatosis. 5. Stable right renal scarring.  Small renal cysts, also stable.   Electronically Signed   By: Amie Portlandavid  Ormond M.D.   On: 02/23/2016 15:37  ------------------------------------  She has diabetes and she is seeing diabetic doctor for that; every three months; she is  on metformin but had been on this for years; just got the diarrhea about two months ago  She has carotid atherosclerosis; I ordered carotid US back in April but she did not have these done  Dep[facility] screen [facility] 10/31/2016 07/21/2016 04/25/2016 02/16/2016  Decreased Interest 0 0 0 0  Down, Depressed, Hopeless 0 1 0 0  PHQ - 2 Score 0 1 0 0   Relevant past medical, surgical, family and social history reviewed Past Medical History:  Diagnosis Date  . Carotid atherosclerosis 2017  . Diabetes mellitus without complication (HCC)   . DJD (degenerative joint disease)   . History of ovarian cancer    she states approximately 60 years ago.  Marland Kitchen. Hyperlipidemia   . Hypertension    Past Surgical History:  Procedure Laterality Date  . ABDOMINAL HYSTERECTOMY     partial, due to ovarian cancer  . APPENDECTOMY    . CATARACT EXTRACTION     Family History  Problem Relation Age of Onset  . Cancer Son 440    stomach   Social History  Substance Use Topics  . Smoking status: Never Smoker  . Smokeless tobacco: Never Used  . Alcohol use No   Interim medical history since last visit reviewed. Allergies and medications reviewed  Review of Systems Per HPI unless specifically indicated above     Objective:    BP 130/66   Pulse  88   Temp 98.7 F (37.1 C)   Resp 16   Wt 137 lb 9 oz (62.4 kg)   SpO2 90%   BMI 24.37 kg/m   Wt Readings from Last 3 Encounters:  10/31/16 137 lb 9 oz (62.4 kg)  07/21/16 140 lb (63.5 kg)  07/21/16 140 lb 11.2 oz (63.8 kg)    Physical Exam  Constitutional: She appears well-developed and well-nourished.  HENT:  Head: Normocephalic and atraumatic.  Mouth/Throat: Mucous membranes are normal.  Eyes: EOM are normal. No scleral icterus.  Cardiovascular: Normal rate and regular rhythm.   Pulmonary/Chest: Effort normal and breath sounds normal.  Abdominal: She exhibits no distension. There is no guarding.  Fullness and mild tenderness along left lower quadrant,  left colic gutter  Neurological: She is alert.  Skin: Skin is warm. No pallor.  Psychiatric: She has a normal mood and affect. Her behavior is normal.   Diabetic Foot Form - Detailed   Diabetic Foot Exam - detailed Diabetic Foot exam was performed with the following findings:  Yes 10/31/2016  5:04 PM  Visual Foot Exam completed.:  Yes  Is there a history of foot ulcer?:  No Are the toenails ingrown?:  No Pulse Foot Exam completed.:  Yes  Right Dorsalis Pedis:  Present Left Dorsalis Pedis:  Present  Semmes-Weinstein Monofilament Test       Results for orders placed or performed during the hospital encounter of 07/21/16  Basic metabolic panel  Result Value Ref Range   Sodium 135 135 - 145 mmol/L   Potassium 4.3 3.5 - 5.1 mmol/L   Chloride 103 101 - 111 mmol/L   CO2 21 (L) 22 - 32 mmol/L   Glucose, Bld 231 (H) 65 - 99 mg/dL   BUN 18 6 - 20 mg/dL   Creatinine, Ser 1.61 0.44 - 1.00 mg/dL   Calcium 9.4 8.9 - 09.6 mg/dL   GFR calc non Af Amer >60 >60 mL/min   GFR calc Af Amer >60 >60 mL/min   Anion gap 11 5 - 15  CBC  Result Value Ref Range   WBC 9.2 3.6 - 11.0 K/uL   RBC 3.95 3.80 - 5.20 MIL/uL   Hemoglobin 12.2 12.0 - 16.0 g/dL   HCT 04.5 (L) 40.9 - 81.1 %   MCV 87.7 80.0 - 100.0 fL   MCH 30.8 26.0 - 34.0 pg   MCHC 35.2 32.0 - 36.0 g/dL   RDW 91.4 78.2 - 95.6 %   Platelets 212 150 - 440 K/uL  Troponin I  Result Value Ref Range   Troponin I <0.03 <0.03 ng/mL      Assessment & Plan:   Problem List Items Addressed This Visit      Cardiovascular and Mediastinum   Essential hypertension, benign    Excellent control      Carotid atherosclerosis    Re-order carotid US; discussed risk of stroke      Relevant Orders   US Carotid Bilateral (Completed)     Endocrine   Type 2 diabetes mellitus with hyperglycemia (HCC)    Foot exam by MD today; seeing endocrinologist for management        Other   Pelvic mass in female    With symptoms of partial obstruction now;  will get AAS and then refer for [place]; suspect mass pressing against colon, resulting in diarrhea (watery material getting past narrowed area); if worse, go to ER      Relevant Orders   US Transvaginal Non-OB (  Completed)   US Pelvis Complete (Completed)   DG Abd Acute W/Chest (Completed)   Abdominal pain   Relevant Orders   US Transvaginal Non-OB (Completed)   US Pelvis Complete (Completed)   US Abdomen Complete (Completed)   DG Abd Acute W/Chest (Completed)      Follow up plan: Return in about 4 weeks (around 11/28/2016) for 40 minute appointment with interpreter.  An after-visit summary was printed and given to the patient at check-out.  Please see the patient instructions which may contain other information and recommendations beyond what is mentioned above in the assessment and plan.  No orders of the defined types were placed in this encounter.   Orders Placed This Encounter  Procedures  . US Transvaginal Non-OB  . US Pelvis Complete  . US Abdomen Complete  . DG Abd Acute W/Chest  . US Carotid Bilateral  Face-to-face time with patient was more than 25 minutes, >50% time spent counseling and coordination of care

## 2016-11-01 ENCOUNTER — Telehealth: Payer: Self-pay | Admitting: Family Medicine

## 2016-11-01 DIAGNOSIS — R932 Abnormal findings on diagnostic imaging of liver and biliary tract: Secondary | ICD-10-CM

## 2016-11-01 DIAGNOSIS — I6523 Occlusion and stenosis of bilateral carotid arteries: Secondary | ICD-10-CM

## 2016-11-01 NOTE — Telephone Encounter (Signed)
Please call the cancer center; I don't know if patient's oncologist is still there; if so, I'd like to speak with him; if not, find out who would have taken over his patients, who will the patient be seeing now, because I need to talk to them about her scan results; when you get them to the phone, come get me; thank you

## 2016-11-01 NOTE — Telephone Encounter (Signed)
Patient has seen Leida LauthAndrew Berchuck, MD in the past, Obstetrics and Gynecology and has an upcoming appointment on 11/08/16.

## 2016-11-01 NOTE — Telephone Encounter (Signed)
Please arrange for me to speak with patient using Korean-speaking interpreter on the phone to go over test results; come get me when we're ready so I can review her scan with her; thank you

## 2016-11-02 ENCOUNTER — Telehealth: Payer: Self-pay

## 2016-11-02 NOTE — Telephone Encounter (Signed)
No notes need open in error

## 2016-11-02 NOTE — Telephone Encounter (Signed)
I spoke with daughter using Korean-speaking interpreter on the phone line; the mass in her pelvis has grown, so this is worrisome for malignancy; slow-growing, though, but I think this explains her GI symptoms as we discussed in the office; she has an appt to see GYN-ONC next week; if symptoms worsen, then go to ER Carotids show blockages; continue aspirin and statin; I'll send to vascular for completeness, to monitor; doubt surgery needed Gallbladder is abnormal; daughter says no pain RUQ, just left side; will refer to surgeon for evaluation

## 2016-11-02 NOTE — Assessment & Plan Note (Signed)
Cannot r/o malignancy; no pain there; refer to gen surg for opinion

## 2016-11-02 NOTE — Assessment & Plan Note (Signed)
50-69% with turbulent flow on the LEFT, <50% on the RIGHT; refer to vasc; continue aspirin plus statin

## 2016-11-08 ENCOUNTER — Inpatient Hospital Stay: Payer: Medicare Other | Attending: Obstetrics and Gynecology

## 2016-11-09 ENCOUNTER — Other Ambulatory Visit: Payer: Self-pay

## 2016-11-10 ENCOUNTER — Ambulatory Visit: Payer: Self-pay | Admitting: Surgery

## 2016-11-13 NOTE — Progress Notes (Signed)
  Oncology Nurse Navigator Documentation Message sent to scheduling to reschedule from no show appt with Gyn Onc Navigator Location: [facility] (11/13/16 0900)   )Navigator Encounter Type: Follow-up Appt (11/13/16 0900)                         Barriers/Navigation Needs: Coordination of Care (11/13/16 0900)   Interventions: Coordination of Care (11/13/16 0900)   Coordination of Care: Appts (11/13/16 0900)                  Time Spent with Patient: 15 (11/13/16 0900)

## 2016-11-14 ENCOUNTER — Encounter: Payer: Self-pay | Admitting: General Surgery

## 2016-11-14 ENCOUNTER — Ambulatory Visit (INDEPENDENT_AMBULATORY_CARE_PROVIDER_SITE_OTHER): Payer: Medicare Other | Admitting: General Surgery

## 2016-11-14 VITALS — BP 135/76 | HR 85 | Temp 98.4°F | Ht 62.0 in | Wt 137.0 lb

## 2016-11-14 DIAGNOSIS — R1012 Left upper quadrant pain: Secondary | ICD-10-CM

## 2016-11-14 NOTE — Patient Instructions (Signed)
Please show up to your appointment at 10:45 AM on 11/22/2016 at the [facility].   [facility] Address: [address], [place], [place] [address]  Phone: [phone]

## 2016-11-14 NOTE — Progress Notes (Signed)
Patient ID: Tasha Rivera, female   DOB: Mar 16, 1924, 83 y.o.   MRN: [id number]  CC: ABDOMINAL PAIN  HPI Tasha Rivera is a 83 y.o. female Center clinic for evaluation of abdominal pain and gallbladder wall thickening. Patient is [place] speaking and entire visit is obtained using a [place] interpreter. Patient reports long-standing left-sided and left upper quadrant pain. She denies any right upper quadrant pain. She cannot associate any pain with her diet. She denies any nausea, vomiting, chest pain, shortness of breath. She's been having diarrhea and alternating constipation. She is not completely aware of why she is eating seen by me in clinic today.  HPI  Past Medical History:  Diagnosis Date  . Carotid atherosclerosis 2017  . Diabetes mellitus without complication ([place])   . DJD (degenerative joint disease)   . History of ovarian cancer    she states approximately 60 years ago.  Marland Kitchen Hyperlipidemia   . Hypertension     Past Surgical History:  Procedure Laterality Date  . ABDOMINAL HYSTERECTOMY     partial, due to ovarian cancer  . APPENDECTOMY    . CATARACT EXTRACTION      Family History  Problem Relation Age of Onset  . Cancer Son 93    stomach    Social History Social History  Substance Use Topics  . Smoking status: Never Smoker  . Smokeless tobacco: Never Used  . Alcohol use No    No Known Allergies  Current Outpatient Prescriptions  Medication Sig Dispense Refill  . aspirin EC 81 MG tablet Take 1 tablet (81 mg total) by mouth daily. 30 tablet 11  . Blood Glucose Monitoring Suppl (FIFTY50 GLUCOSE METER 2.0) w/Device KIT     . glipiZIDE (GLUCOTROL) 10 MG tablet Take by mouth.    . linagliptin (TRADJENTA) 5 MG TABS tablet Take by mouth.    . losartan (COZAAR) 100 MG tablet Take 1 tablet (100 mg total) by mouth daily. 90 tablet 1  . metFORMIN (GLUCOPHAGE) 1000 MG tablet TK 1 T PO BID WITH MEALS  3  . pantoprazole (PROTONIX) 40 MG tablet Take 1 tablet (40 mg total) by mouth  daily. 90 tablet 0  . pravastatin (PRAVACHOL) 20 MG tablet Take 1 tablet (20 mg total) by mouth at bedtime. 90 tablet 1   No current facility-administered medications for this visit.      Review of Systems A Multi-point review of systems was asked and was negative except for the findings documented in the history of present illness  Physical Exam Blood pressure 135/76, pulse 85, temperature 98.4 F (36.9 C), temperature source Oral, height 5' 2"  (1.575 m), weight 62.1 kg (137 lb). CONSTITUTIONAL: No acute distress. EYES: Pupils are equal, round, and reactive to light, Sclera are non-icteric. EARS, NOSE, MOUTH AND THROAT: The oropharynx is clear. The oral mucosa is pink and moist. Hearing is intact to voice. LYMPH NODES:  Lymph nodes in the neck are normal. RESPIRATORY:  Lungs are clear. There is normal respiratory effort, with equal breath sounds bilaterally, and without pathologic use of accessory muscles. CARDIOVASCULAR: Heart is regular without murmurs, gallops, or rubs. GI: The abdomen is soft, minimally tender in the left lower and upper quadrant, and nondistended. There are no palpable masses and there is a well-healed lower midline incision site. There is no hepatosplenomegaly. There are normal bowel sounds in all quadrants. GU: Rectal deferred.   MUSCULOSKELETAL: Normal muscle strength and tone. No cyanosis or edema.   SKIN: Turgor is good and there are  no pathologic skin lesions or ulcers. NEUROLOGIC: Motor and sensation is grossly normal. Cranial nerves are grossly intact. PSYCH:  Oriented to person, place and time. Affect is normal.  Data Reviewed Images and labs reviewed. Gallbladder does not show any gallstones but does show a small segment of what appears to be gallbladder wall thickening. Remainder the gallbladder wall however is of normal caliber. There are no recent labs to review. Patient has a pelvic mass that is enlarged from 7 cm to 10 cm in its greater diameter over  the last year. I have personally reviewed the patient's imaging, laboratory findings and medical records.    Assessment    Abdominal pain    Plan    83 year old female with left-sided abdominal pain. Ultrasound shows a nonspecific gallbladder wall thickening. When confirmed recent CT scan the area of gallbladder wall thickening appears to be in the area where the gallbladder it adjacent to the small bowel. This is likely not of any clinical significance. She has no right upper quadrant pain or tenderness. Given her history she may be having obstructive type symptoms from the enlarging cystic mass in her pelvis. She has an appointment with gynecologic oncology in 2 weeks. She is to keep that appointment. No current indications for any general surgical intervention. All questions answered to the patient's satisfaction.     Time spent with the patient was 45 minutes, with more than 50% of the time spent in face-to-face education, counseling and care coordination.     Clayburn Pert, MD FACS General Surgeon 11/14/2016, 4:11 PM

## 2016-11-17 ENCOUNTER — Ambulatory Visit (INDEPENDENT_AMBULATORY_CARE_PROVIDER_SITE_OTHER): Payer: Medicare Other | Admitting: Vascular Surgery

## 2016-11-17 ENCOUNTER — Encounter (INDEPENDENT_AMBULATORY_CARE_PROVIDER_SITE_OTHER): Payer: Self-pay | Admitting: Vascular Surgery

## 2016-11-17 VITALS — BP 139/93 | HR 89 | Resp 16

## 2016-11-17 DIAGNOSIS — E785 Hyperlipidemia, unspecified: Secondary | ICD-10-CM | POA: Diagnosis not present

## 2016-11-17 DIAGNOSIS — I10 Essential (primary) hypertension: Secondary | ICD-10-CM | POA: Diagnosis not present

## 2016-11-17 DIAGNOSIS — M542 Cervicalgia: Secondary | ICD-10-CM | POA: Diagnosis not present

## 2016-11-17 DIAGNOSIS — I6523 Occlusion and stenosis of bilateral carotid arteries: Secondary | ICD-10-CM | POA: Diagnosis not present

## 2016-11-17 DIAGNOSIS — E1165 Type 2 diabetes mellitus with hyperglycemia: Secondary | ICD-10-CM | POA: Diagnosis not present

## 2016-11-17 DIAGNOSIS — I6529 Occlusion and stenosis of unspecified carotid artery: Secondary | ICD-10-CM | POA: Insufficient documentation

## 2016-11-17 DIAGNOSIS — G8929 Other chronic pain: Secondary | ICD-10-CM

## 2016-11-17 NOTE — Assessment & Plan Note (Signed)
blood glucose control important in reducing the progression of atherosclerotic disease. Also, involved in wound healing. On appropriate medications.  

## 2016-11-17 NOTE — Assessment & Plan Note (Signed)
This is unlikely related to her neck pain. This is more likely related to arthritis or disc disease in her neck.

## 2016-11-17 NOTE — Assessment & Plan Note (Signed)
blood pressure control important in reducing the progression of atherosclerotic disease. On appropriate oral medications.  

## 2016-11-17 NOTE — Patient Instructions (Signed)
Carotid Artery Disease The carotid arteries are the two main arteries on either side of the neck that supply blood to the brain. Carotid artery disease, also called carotid artery stenosis, is the narrowing or blockage of one or both carotid arteries. Carotid artery disease increases your risk for a stroke or a transient ischemic attack (TIA). A TIA is an episode in which a waxy, fatty substance that accumulates within the artery (plaque) blocks blood flow to the brain. A TIA is considered a "warning stroke." What are the causes?  Buildup of plaque inside the carotid arteries (atherosclerosis) (common).  A weakened outpouching in an artery (aneurysm).  Inflammation of the carotid artery (arteritis).  A fibrous growth within the carotid artery (fibromuscular dysplasia).  Tissue death within the carotid artery due to radiation treatment (post-radiation necrosis).  Decreased blood flow due to spasms of the carotid artery (vasospasm).  Separation of the walls of the carotid artery (carotid dissection). What increases the risk?  High cholesterol (dyslipidemia).  High blood pressure (hypertension).  Smoking.  Obesity.  Diabetes.  Family history of cardiovascular disease.  Inactivity or lack of regular exercise.  Being female. Men have an increased risk of developing atherosclerosis earlier in life than women. What are the signs or symptoms? Carotid artery disease does not cause symptoms. How is this diagnosed? Diagnosis of carotid artery disease may include:  A physical exam. Your health care provider may hear an abnormal sound (bruit) when listening to the carotid arteries.  Specific tests that look at the blood flow in the carotid arteries. These tests include: ? Carotid artery ultrasonography. ? Carotid or cerebral angiography. ? Computerized tomographic angiography (CTA). ? Magnetic resonance angiography (MRA).  How is this treated? Treatment of carotid artery disease can  include a combination of treatments. Treatment options include:  Surgery. You may have: ? A carotid endarterectomy. This is a surgery to remove the blockages in the carotid arteries. ? A carotid angioplasty with stenting. This is a nonsurgical interventional procedure. A wire mesh (stent) is used to widen the blocked carotid arteries.  Medicines to control blood pressure, cholesterol, and reduce blood clotting (antiplatelet therapy).  Adjusting your diet.  Lifestyle changes such as: ? Quitting smoking. ? Exercising as tolerated or as directed by your health care provider. ? Controlling and maintaining a good blood pressure. ? Keeping cholesterol levels under control.  Follow these instructions at home:  Take medicines only as directed by your health care provider. Make sure you understand all your medicine instructions. Do not stop your medicines without talking to your health care provider.  Follow your health care provider's diet instructions. It is important to eat a healthy diet that is low in saturated fats and includes plenty of fresh fruits, vegetables, and lean meats. High-fat, high-sodium foods as well as foods that are fried, overly processed, or have poor nutritional value should be avoided.  Maintain a healthy weight.  Stay physically active. It is recommended that you get at least 30 minutes of activity every day.  Do not use any tobacco products including cigarettes, chewing tobacco, or electronic cigarettes. If you need help quitting, ask your health care provider.  Limit alcohol use to: ? No more than 2 drinks per day for men. ? No more than 1 drink per day for nonpregnant women.  Do not use illegal drugs.  Keep all follow-up visits as directed by your health care provider. Get help right away if: You develop TIA or stroke symptoms. These include:  Sudden   weakness or numbness on one side of the body, such as in the face, arm, or leg.  Sudden  confusion.  Trouble speaking (aphasia) or understanding.  Sudden trouble seeing out of one or both eyes.  Sudden trouble walking.  Dizziness or feeling like you might faint.  Loss of balance or coordination.  Sudden severe headache with no known cause.  Sudden trouble swallowing (dysphagia).  If you have any of these symptoms, call your local emergency services (911 in [place] Do not drive yourself to the clinic or hospital. This is a medical emergency. This information is not intended to replace advice given to you by your health care provider. Make sure you discuss any questions you have with your health care provider. Document Released: 01/15/2012 Document Revised: 03/30/2016 Document Reviewed: 04/23/2013 Elsevier Interactive Patient Education  2017 [employer]  

## 2016-11-17 NOTE — Assessment & Plan Note (Signed)
A recent carotid duplex performed at the hospital last month demonstrates mild, less than 50% right carotid artery stenosis and moderate, 50-69% left carotid artery stenosis. The patient denies focal neurologic deficits. She is on appropriate medical therapy with aspirin and a statin agent. We discussed the importance of good blood pressure and glucose control as well. Given her age, asymptomatic status, and degree of stenosis of less than 75% no intervention would be recommended. Plan to recheck with ultrasound in 6 months.

## 2016-11-17 NOTE — Progress Notes (Signed)
Patient ID: Tasha Rivera, female   DOB: 1924-09-04, 83 y.o.   MRN: [id number]  Chief Complaint  Patient presents with  . New Patient (Initial Visit)    HPI Tasha Rivera is a 83 y.o. female.  I am asked to see the patient by Dr. Sanda Klein for evaluation of carotid artery disease.  The patient reports some pain in her neck as well as headaches and diminished hearing. These of all been present for many months without a clear inciting event or causative factor. She denies any focal neurologic symptoms. Specifically, the patient denies amaurosis fugax, speech or swallowing difficulties, or arm or leg weakness or numbness. A recent carotid duplex performed at the hospital last month demonstrates mild, less than 50% right carotid artery stenosis and moderate, 50-69% left carotid artery stenosis. With this finding, she was referred for further evaluation and treatment.   Past Medical History:  Diagnosis Date  . Carotid atherosclerosis 2017  . Diabetes mellitus without complication ([place])   . DJD (degenerative joint disease)   . History of ovarian cancer    she states approximately 60 years ago.  Marland Kitchen Hyperlipidemia   . Hypertension     Past Surgical History:  Procedure Laterality Date  . ABDOMINAL HYSTERECTOMY     partial, due to ovarian cancer  . APPENDECTOMY    . CATARACT EXTRACTION      Family History  Problem Relation Age of Onset  . Cancer Son 40    stomach  No bleeding disorders, clotting disorders, or autoimmune diseases  Social History Social History  Substance Use Topics  . Smoking status: Never Smoker  . Smokeless tobacco: Never Used  . Alcohol use No  She was a Company secretary.  No Known Allergies  Current Outpatient Prescriptions  Medication Sig Dispense Refill  . aspirin EC 81 MG tablet Take 1 tablet (81 mg total) by mouth daily. 30 tablet 11  . Blood Glucose Monitoring Suppl (FIFTY50 GLUCOSE METER 2.0) w/Device KIT     . glipiZIDE (GLUCOTROL) 10 MG tablet Take by mouth.    .  linagliptin (TRADJENTA) 5 MG TABS tablet Take by mouth.    . losartan (COZAAR) 100 MG tablet Take 1 tablet (100 mg total) by mouth daily. 90 tablet 1  . metFORMIN (GLUCOPHAGE) 1000 MG tablet TK 1 T PO BID WITH MEALS  3  . pantoprazole (PROTONIX) 40 MG tablet Take 1 tablet (40 mg total) by mouth daily. 90 tablet 0  . pravastatin (PRAVACHOL) 20 MG tablet Take 1 tablet (20 mg total) by mouth at bedtime. 90 tablet 1   No current facility-administered medications for this visit.       REVIEW OF SYSTEMS (Negative unless checked)  Constitutional: [] Weight loss  [] Fever  [] Chills Cardiac: [] Chest pain   [] Chest pressure   [] Palpitations   [] Shortness of breath when laying flat   [] Shortness of breath at rest   [] Shortness of breath with exertion. Vascular:  [] Pain in legs with walking   [] Pain in legs at rest   [] Pain in legs when laying flat   [] Claudication   [] Pain in feet when walking  [] Pain in feet at rest  [] Pain in feet when laying flat   [] History of DVT   [] Phlebitis   [] Swelling in legs   [] Varicose veins   [] Non-healing ulcers Pulmonary:   [] Uses home oxygen   [] Productive cough   [] Hemoptysis   [] Wheeze  [] COPD   [] Asthma Neurologic:  [] Dizziness  [] Blackouts   [] Seizures   []   History of stroke   [] History of TIA  [] Aphasia   [] Temporary blindness   [] Dysphagia   [] Weakness or numbness in arms   [] Weakness or numbness in legs Musculoskeletal:  [x] Arthritis   [] Joint swelling   [x] Joint pain   [] Low back pain Hematologic:  [] Easy bruising  [] Easy bleeding   [] Hypercoagulable state   [] Anemic  [] Hepatitis Gastrointestinal:  [] Blood in stool   [] Vomiting blood  [] Gastroesophageal reflux/heartburn   [] Abdominal pain Genitourinary:  [] Chronic kidney disease   [] Difficult urination  [] Frequent urination  [] Burning with urination   [] Hematuria Skin:  [] Rashes   [] Ulcers   [] Wounds Psychological:  [] History of anxiety   []  History of major depression.    Physical Exam BP (!) 139/93   Pulse  89   Resp 16  Gen:  WD/WN, NAD. Appears younger than stated age Head: [place]/AT, No temporalis wasting. Prominent temp pulse not noted. Ear/Nose/Throat: Hearing grossly intact, nares w/o erythema or drainage, oropharynx w/o Erythema/Exudate Eyes: Conjunctiva clear, sclera non-icteric  Neck: trachea midline.  No bruit or JVD.  Pulmonary:  Good air movement, clear to auscultation bilaterally.  Cardiac: RRR, normal S1, S2, no Murmurs, rubs or gallops. Vascular:  Vessel Right Left  Radial Palpable Palpable                                   Gastrointestinal: soft, non-tender/non-distended. No guarding/reflex.  Musculoskeletal: In a wheelchair.  Extremities without ischemic changes.  No deformity or atrophy.  Neurologic: Sensation grossly intact in extremities.  Symmetrical.  Speech is fluent. Motor exam as listed above. Psychiatric: Judgment intact, Mood & affect appropriate for pt's clinical situation. Very pleasant Dermatologic: No rashes or ulcers noted.  No cellulitis or open wounds. Lymph : No Cervical, Axillary, or Inguinal lymphadenopathy.   Radiology US Abdomen Complete  Result Date: 10/31/2016 CLINICAL DATA:  Left-sided abdominal pain. EXAM: ABDOMEN ULTRASOUND COMPLETE COMPARISON:  CT 02/23/2016. FINDINGS: Gallbladder: No gallstones. Small area of focal wall thickening measured 5.5 mm noted . Common bile duct: Diameter: 7.9 mm Liver: No focal lesion identified. Within normal limits in parenchymal echogenicity. IVC: No abnormality visualized. Pancreas: Visualized portion unremarkable. Spleen: Size and appearance within normal limits. Right Kidney: Length: 9.9 cm. Echogenicity within normal limits. No hydronephrosis visualized. 6 mm simple cyst. Left Kidney: Length: 10.2 cm. Echogenicity within normal limits. No mass or hydronephrosis visualized. Abdominal aorta: No aneurysm visualized. Other findings: None. IMPRESSION: 1. Small area of focal gallbladder wall thickening to 5.5 mm.  Developing changes of cholecystitis cannot be excluded. Gallbladder malignancy cannot be completely excluded. Follow-up exam can be obtained as needed. No gallstones noted. No biliary distention. 2. Exam otherwise unremarkable. Electronically Signed   By: Marcello Moores  Register   On: 10/31/2016 13:17   US Transvaginal Non-ob  Result Date: 10/31/2016 CLINICAL DATA:  Abdominal pain. EXAM: TRANSABDOMINAL AND TRANSVAGINAL ULTRASOUND OF PELVIS TECHNIQUE: Both transabdominal and transvaginal ultrasound examinations of the pelvis were performed. Transabdominal technique was performed for global imaging of the pelvis including uterus, ovaries, adnexal regions, and pelvic cul-de-sac. It was necessary to proceed with endovaginal exam following the transabdominal exam to visualize the adnexum. COMPARISON:  CT 02/23/2016. FINDINGS: Uterus Hysterectomy. Right ovary Not visualized. Left ovary Not visualized. Other findings Complex septated cystic mass measuring 10.0 x 7.1 x 9.1 cm. This is increased in size from prior CT of 02/23/2016 . Cystic neoplasm again could present this fashion. Repeat abdominal and pelvic CT  scan can be obtained for comparison purposes. IMPRESSION: 10.0 x 7.1 x 9.1 cm complex cystic pelvic mass. This is increased in size from prior CT of 02/23/2016 . This is worrisome for cystic neoplasm. Repeat abdominal and pelvic CT scan for comparison purposes can be obtained. Electronically Signed   By: Marcello Moores  Register   On: 10/31/2016 13:31   US Pelvis Complete  Result Date: 10/31/2016 CLINICAL DATA:  Abdominal pain. EXAM: TRANSABDOMINAL AND TRANSVAGINAL ULTRASOUND OF PELVIS TECHNIQUE: Both transabdominal and transvaginal ultrasound examinations of the pelvis were performed. Transabdominal technique was performed for global imaging of the pelvis including uterus, ovaries, adnexal regions, and pelvic cul-de-sac. It was necessary to proceed with endovaginal exam following the transabdominal exam to visualize the  adnexum. COMPARISON:  CT 02/23/2016. FINDINGS: Uterus Hysterectomy. Right ovary Not visualized. Left ovary Not visualized. Other findings Complex septated cystic mass measuring 10.0 x 7.1 x 9.1 cm. This is increased in size from prior CT of 02/23/2016 . Cystic neoplasm again could present this fashion. Repeat abdominal and pelvic CT scan can be obtained for comparison purposes. IMPRESSION: 10.0 x 7.1 x 9.1 cm complex cystic pelvic mass. This is increased in size from prior CT of 02/23/2016 . This is worrisome for cystic neoplasm. Repeat abdominal and pelvic CT scan for comparison purposes can be obtained. Electronically Signed   By: Marcello Moores  Register   On: 10/31/2016 13:31   US Carotid Bilateral  Result Date: 10/31/2016 CLINICAL DATA:  Carotid atherosclerosis EXAM: BILATERAL CAROTID DUPLEX ULTRASOUND TECHNIQUE: Pearline Cables scale imaging, color Doppler and duplex ultrasound were performed of bilateral carotid and vertebral arteries in the neck. COMPARISON:  None available FINDINGS: Criteria: Quantification of carotid stenosis is based on velocity parameters that correlate the residual internal carotid diameter with NASCET-based stenosis levels, using the diameter of the distal internal carotid lumen as the denominator for stenosis measurement. The following velocity measurements were obtained: RIGHT ICA:  55/18 cm/sec CCA:  88/32 cm/sec SYSTOLIC ICA/CCA RATIO:  1.5 DIASTOLIC ICA/CCA RATIO:  2.0 ECA:  142 cm/sec LEFT ICA:  208/33 cm/sec CCA:  54/9 cm/sec SYSTOLIC ICA/CCA RATIO:  4.3 DIASTOLIC ICA/CCA RATIO:  4.1 ECA:  69 cm/sec RIGHT CAROTID ARTERY: Mild heterogeneous partially calcified right carotid bifurcation atherosclerosis. No significant ICA luminal narrowing, velocity elevation, turbulent flow, or significant stenosis. Degree of narrowing less than 50%. RIGHT VERTEBRAL ARTERY:  Antegrade LEFT CAROTID ARTERY: Heterogeneous calcified left carotid bifurcation atherosclerosis. This narrows the proximal ICA lumen. In  this region there is turbulent flow evident with an elevated velocity of 208/33 cm/sec. By ultrasound criteria, proximal left ICA stenosis is estimated at 50- 69%. LEFT VERTEBRAL ARTERY:  Antegrade IMPRESSION: Left greater than right carotid atherosclerosis. Proximal left ICA stenosis estimated at 50- 69% Minor right ICA narrowing, less than 50% Patent antegrade vertebral flow bilaterally Electronically Signed   By: Jerilynn Mages.  Shick M.D.   On: 10/31/2016 14:05   Dg Abd Acute W/chest  Result Date: 10/31/2016 CLINICAL DATA:  Diarrhea.  Pain . EXAM: DG ABDOMEN ACUTE W/ 1V CHEST COMPARISON:  07/21/2016. FINDINGS: No acute cardiopulmonary disease. Prominent amount of stool in the left colon. Constipation cannot be excluded.Scattered air-fluid levels noted throughout the transverse and right colon. No free air noted. IMPRESSION: 1. Prominent amount stool noted throughout the left colon. Scattered air-fluid levels noted throughout the transverse right colon. No bowel distention. Constipation cannot be excluded. 2. No acute cardiopulmonary disease. Electronically Signed   By: [place]   On: 10/31/2016 11:12    Labs No  results found for this or any previous visit (from the past 2160 hour(s)).  Assessment/Plan:  Type 2 diabetes mellitus with hyperglycemia blood glucose control important in reducing the progression of atherosclerotic disease. Also, involved in wound healing. On appropriate medications.   Essential hypertension, benign blood pressure control important in reducing the progression of atherosclerotic disease. On appropriate oral medications.   Hyperlipidemia lipid control important in reducing the progression of atherosclerotic disease. Continue statin therapy   Neck pain, chronic This is unlikely related to her neck pain. This is more likely related to arthritis or disc disease in her neck.  Carotid stenosis A recent carotid duplex performed at the hospital last month demonstrates  mild, less than 50% right carotid artery stenosis and moderate, 50-69% left carotid artery stenosis. The patient denies focal neurologic deficits. She is on appropriate medical therapy with aspirin and a statin agent. We discussed the importance of good blood pressure and glucose control as well. Given her age, asymptomatic status, and degree of stenosis of less than 75% no intervention would be recommended. Plan to recheck with ultrasound in 6 months.      Leotis Pain 11/17/2016, 4:49 PM   This note was created with Dragon medical transcription system.  Any errors from dictation are unintentional.

## 2016-11-17 NOTE — Assessment & Plan Note (Signed)
lipid control important in reducing the progression of atherosclerotic disease. Continue statin therapy  

## 2016-11-22 ENCOUNTER — Inpatient Hospital Stay: Payer: Medicare Other

## 2016-11-22 NOTE — Progress Notes (Deleted)
Gynecologic Oncology Interval Visit   Referring Provider: Dr Sanda Klein  Chief Concern: pelvic mass  Subjective:  Tasha Rivera is a 83 y.o. P108 female who is seen initially in consultation from Dr. Sanda Klein for pelvic mass in 03/2016. She declined surgery.  CEA 1.7 and CA125 11.1   Repeat imaging with Pelvic ultrasound on 10/31/2016 revealed the following:  10.0 x 7.1 x 9.1 cm complex cystic pelvic mass. This is increased in size from prior CT of 02/23/2016 . This is worrisome for cystic neoplasm. Repeat abdominal and pelvic CT scan for comparison purposes can be obtained.  Tasha Rivera is a 83 y.o. P38 female who is seen initially in consultation from Dr. Sanda Klein for pelvic mass.  Patient complained of abdominal pain in 1/17 and CT scan ordered.    She had a hysterectomy and in [place] around age 58, they left one ovary the daughter says; the hysterectomy was for cysts; five hour surgery, no cancer  11/30/15 CT scan IMPRESSION:  Multiloculated complex cystic pelvic mass, abutting the left adnexa. Differential diagnosis includes peritoneal inclusion cyst, paraovarian cyst, low-grade left ovarian malignancy if the left ovary is present. Pelvic abscess although possible is felt unlikely. Surgical consult is recommended.  02/23/16 CT scan IMPRESSION:  1. Cystic mass in the pelvis has mildly increased in size, now measuring 7.3 x 5.2 x 6.8 cm, previously 6.9 x 5.0 x 6.4 cm. There is new dystrophic appearing calcification along its inferior margin. A cystic neoplasm is suspected given the mild interval growth. This still could reflect a paraovarian cyst or inclusion cyst. 2. There is no CT evidence of locally invasive carcinoma or of metastatic disease. 3. No acute findings. 4. Hepatic steatosis. 5. Stable right renal scarring. Small renal cysts, also stable.   Problem List: Patient Active Problem List   Diagnosis Date Noted  . Carotid stenosis 11/17/2016  . Abdominal pain  10/31/2016  . Chest pain at rest 07/26/2016  . Carpal tunnel syndrome on right 07/21/2016  . Carotid atherosclerosis 02/18/2016  . Cervical spine arthritis ([place]) 02/18/2016  . Neck pain, chronic 02/16/2016  . Eye dryness 02/16/2016  . Leukocytosis 12/19/2015  . Pelvic mass in female 12/02/2015  . Degenerative disc disease, lumbar 11/29/2015  . Aorto-iliac atherosclerosis ([place]) 11/29/2015  . Degenerative joint disease (DJD) of hip 11/29/2015  . Blurred vision, bilateral 11/22/2015  . Low back pain 10/21/2015  . Pelvic pain in female 10/21/2015  . Osteoporosis 09/26/2015  . Primary osteoarthritis of both knees 09/22/2015  . Shoulder pain, left 07/22/2015  . Hyponatremia 07/22/2015  . Hyperlipidemia 07/22/2015  . Hypochloremia 07/22/2015  . Type 2 diabetes mellitus with hyperglycemia ([place]) 07/06/2015  . Essential hypertension, benign 07/06/2015  . Medication monitoring encounter 07/06/2015  . Chronic left flank pain 07/06/2015  . Arthritis of both knees 07/06/2015  . Psoriasis, guttate 07/06/2015  . Post-menopausal 07/06/2015  . Gait abnormality 07/06/2015  . DJD (degenerative joint disease) of knee 07/16/2013  . Knee pain, bilateral 07/16/2013    Past Medical History: Past Medical History:  Diagnosis Date  . Carotid atherosclerosis 2017  . Diabetes mellitus without complication ([place])   . DJD (degenerative joint disease)   . History of ovarian cancer    she states approximately 60 years ago.  Tasha Rivera Hyperlipidemia   . Hypertension     Past Surgical History: Past Surgical History:  Procedure Laterality Date  . ABDOMINAL HYSTERECTOMY     partial, due to ovarian cancer  . APPENDECTOMY    .  CATARACT EXTRACTION      Family History: Family History  Problem Relation Age of Onset  . Cancer Son 65    stomach    Social History: Social History   Social History  . Marital status: Widowed    Spouse name: N/A  . Number of children: N/A  . Years of education: N/A    Occupational History  . Not on file.   Social History Main Topics  . Smoking status: Never Smoker  . Smokeless tobacco: Never Used  . Alcohol use No  . Drug use: No  . Sexual activity: Not on file   Other Topics Concern  . Not on file   Social History Narrative  . No narrative on file    Allergies: No Known Allergies  Current Medications: Current Outpatient Prescriptions  Medication Sig Dispense Refill  . aspirin EC 81 MG tablet Take 1 tablet (81 mg total) by mouth daily. 30 tablet 11  . Blood Glucose Monitoring Suppl (FIFTY50 GLUCOSE METER 2.0) w/Device KIT     . glipiZIDE (GLUCOTROL) 10 MG tablet Take by mouth.    . linagliptin (TRADJENTA) 5 MG TABS tablet Take by mouth.    . losartan (COZAAR) 100 MG tablet Take 1 tablet (100 mg total) by mouth daily. 90 tablet 1  . metFORMIN (GLUCOPHAGE) 1000 MG tablet TK 1 T PO BID WITH MEALS  3  . pantoprazole (PROTONIX) 40 MG tablet Take 1 tablet (40 mg total) by mouth daily. 90 tablet 0  . pravastatin (PRAVACHOL) 20 MG tablet Take 1 tablet (20 mg total) by mouth at bedtime. 90 tablet 1   No current facility-administered medications for this visit.     Review of Systems Per interval history.   Objective:  Physical Examination:  There were no vitals taken for this visit.   ECOG Performance Status: 1 - Symptomatic but completely ambulatory  General appearance: alert, cooperative and appears stated age HEENT:PERRLA, neck supple with midline trachea and thyroid without masses Lymph node survey: non-palpable, axillary, inguinal, supraclavicular Cardiovascular: regular rate and rhythm, no murmurs or gallops Respiratory: normal air entry, lungs clear to auscultation Breast exam: not examined. Abdomen: soft, non-tender, without masses or organomegaly, no hernias and well healed incision.  1 cm lipoma in left mid abdominal wall. Back: inspection of back is normal Extremities: extremities normal, atraumatic, no cyanosis or  edema Skin exam - normal coloration and turgor, no rashes, no suspicious skin lesions noted. Neurological exam reveals alert, oriented, normal speech, no focal findings or movement disorder noted.  Pelvic: exam chaperoned by nurse;  Vulva: normal appearing vulva with no masses, tenderness or lesions; Vagina: normal vagina; Adnexa: normal adnexa in size, nontender and no masses; Rectal: no masses palpable    Assessment:  Tasha Rivera is a 83 y.o. female diagnosed with multicystic pelvic mass that has increased from 6.9 to 7.3 cm to 10.0 cm.   And she is essentially asymptomatic.    Medical co-morbidities complicating care: advanced age.  Plan:   Problem List Items Addressed This Visit    None      ***We discussed options for management including surgery versus expectant management.  We will get a CA125 and CEA as well as pelvic ultrasound for further characterization and to r/o ovarian cancer.  The patient would like to avoid surgery if at all possible.  Actually, she says she will not have surgery, but I still think it is worth doing the above tests to better quantify her risk of malignancy.  The patient's diagnosis, an outline of the further diagnostic and laboratory studies which will be required, the recommendation, and alternatives were discussed with the patient and her daughter with an interpreter.  All questions were answered to the patient's satisfaction.  A total of 40 minutes were spent with the patient/family today; 50% was spent in education, counseling and coordination of care for pelvic mass.    Gillis Ends, MD  CC:  Arnetha Courser, MD [address] [place], [place] [address] [phone]

## 2016-11-29 ENCOUNTER — Inpatient Hospital Stay: Payer: Medicare Other | Attending: Obstetrics and Gynecology | Admitting: Obstetrics and Gynecology

## 2016-11-29 VITALS — BP 163/84 | HR 96 | Temp 98.6°F | Wt 140.1 lb

## 2016-11-29 DIAGNOSIS — R1900 Intra-abdominal and pelvic swelling, mass and lump, unspecified site: Secondary | ICD-10-CM

## 2016-11-29 DIAGNOSIS — Z90710 Acquired absence of both cervix and uterus: Secondary | ICD-10-CM

## 2016-11-29 DIAGNOSIS — I700 Atherosclerosis of aorta: Secondary | ICD-10-CM | POA: Diagnosis not present

## 2016-11-29 DIAGNOSIS — N281 Cyst of kidney, acquired: Secondary | ICD-10-CM | POA: Diagnosis not present

## 2016-11-29 DIAGNOSIS — M5136 Other intervertebral disc degeneration, lumbar region: Secondary | ICD-10-CM | POA: Diagnosis not present

## 2016-11-29 DIAGNOSIS — M1610 Unilateral primary osteoarthritis, unspecified hip: Secondary | ICD-10-CM | POA: Diagnosis not present

## 2016-11-29 DIAGNOSIS — Z7982 Long term (current) use of aspirin: Secondary | ICD-10-CM | POA: Diagnosis not present

## 2016-11-29 DIAGNOSIS — M170 Bilateral primary osteoarthritis of knee: Secondary | ICD-10-CM | POA: Insufficient documentation

## 2016-11-29 DIAGNOSIS — M4692 Unspecified inflammatory spondylopathy, cervical region: Secondary | ICD-10-CM | POA: Diagnosis not present

## 2016-11-29 DIAGNOSIS — I6523 Occlusion and stenosis of bilateral carotid arteries: Secondary | ICD-10-CM

## 2016-11-29 DIAGNOSIS — K760 Fatty (change of) liver, not elsewhere classified: Secondary | ICD-10-CM | POA: Diagnosis not present

## 2016-11-29 DIAGNOSIS — Z79899 Other long term (current) drug therapy: Secondary | ICD-10-CM | POA: Diagnosis not present

## 2016-11-29 DIAGNOSIS — Z780 Asymptomatic menopausal state: Secondary | ICD-10-CM | POA: Insufficient documentation

## 2016-11-29 DIAGNOSIS — E785 Hyperlipidemia, unspecified: Secondary | ICD-10-CM | POA: Insufficient documentation

## 2016-11-29 DIAGNOSIS — E1165 Type 2 diabetes mellitus with hyperglycemia: Secondary | ICD-10-CM | POA: Diagnosis not present

## 2016-11-29 DIAGNOSIS — I10 Essential (primary) hypertension: Secondary | ICD-10-CM | POA: Diagnosis not present

## 2016-11-29 DIAGNOSIS — E878 Other disorders of electrolyte and fluid balance, not elsewhere classified: Secondary | ICD-10-CM | POA: Insufficient documentation

## 2016-11-29 DIAGNOSIS — Z90721 Acquired absence of ovaries, unilateral: Secondary | ICD-10-CM | POA: Insufficient documentation

## 2016-11-29 DIAGNOSIS — Z7984 Long term (current) use of oral hypoglycemic drugs: Secondary | ICD-10-CM | POA: Insufficient documentation

## 2016-11-29 NOTE — Progress Notes (Signed)
  Oncology Nurse Navigator Documentation Chaperoned pelvic exam. Navigator Location: [facility] (11/29/16 1100)   )                      Patient Visit Type: GynOnc (11/29/16 1100)                              Time Spent with Patient: 15 (11/29/16 1100)

## 2016-11-29 NOTE — Progress Notes (Signed)
Patient here for follow up. She states through interpreter that she is feeling better she still has abdominal pain. She has diarrhea in AM but complains constipation. Writer attempted to assist patient to the bathroom daughter and patient refused assistance.

## 2016-11-29 NOTE — Progress Notes (Signed)
Gynecologic Oncology Consult Visit   Referring Provider: Dr Sanda Klein  Chief Concern: pelvic mass  Subjective:  Tasha Rivera is a 83 y.o. P50 female who is seen in consultation from Dr. Sanda Klein for pelvic mass.  Patient declined surgery May 2017 for this 7 cm pelvic mass.  Tumor markers were normal. Follow up US 10/31/16: Complex septated cystic mass measuring 10.0 x 7.1 x 9.1 cm. This is increased in size from prior CT of 02/23/2016 .   Patient says she is not having abdominal pain, only occasional diarrhea.  No new urinary complaints.  Saw her primary care doctor recently who ordered the above follow up US.   Accompanied by daughter and [place] interpreter.    History of present illess Patient complained of abdominal pain in 1/17 and CT scan ordered.    She had a hysterectomy and in [place] around age 48, they left one ovary the daughter says; the hysterectomy was for cysts; five hour surgery, no cancer  11/30/15 CT scan IMPRESSION:  Multiloculated complex cystic pelvic mass, abutting the left adnexa. Differential diagnosis includes peritoneal inclusion cyst, paraovarian cyst, low-grade left ovarian malignancy if the left ovary is present. Pelvic abscess although possible is felt unlikely. Surgical consult is recommended.  02/23/16 CT scan IMPRESSION:  1. Cystic mass in the pelvis has mildly increased in size, now measuring 7.3 x 5.2 x 6.8 cm, previously 6.9 x 5.0 x 6.4 cm. There is new dystrophic appearing calcification along its inferior margin. A cystic neoplasm is suspected given the mild interval growth. This still could reflect a paraovarian cyst or inclusion cyst. 2. There is no CT evidence of locally invasive carcinoma or of metastatic disease. 3. No acute findings. 4. Hepatic steatosis. 5. Stable right renal scarring. Small renal cysts, also stable.  5/17 CA125 11.1, CEA 1.7.  Patient declined surgical intervention at that time.   Problem List: Patient Active Problem List    Diagnosis Date Noted  . Carotid stenosis 11/17/2016  . Abdominal pain 10/31/2016  . Chest pain at rest 07/26/2016  . Carpal tunnel syndrome on right 07/21/2016  . Carotid atherosclerosis 02/18/2016  . Cervical spine arthritis ([place]) 02/18/2016  . Neck pain, chronic 02/16/2016  . Eye dryness 02/16/2016  . Leukocytosis 12/19/2015  . Pelvic mass in female 12/02/2015  . Degenerative disc disease, lumbar 11/29/2015  . Aorto-iliac atherosclerosis ([place]) 11/29/2015  . Degenerative joint disease (DJD) of hip 11/29/2015  . Blurred vision, bilateral 11/22/2015  . Low back pain 10/21/2015  . Pelvic pain in female 10/21/2015  . Osteoporosis 09/26/2015  . Primary osteoarthritis of both knees 09/22/2015  . Shoulder pain, left 07/22/2015  . Hyponatremia 07/22/2015  . Hyperlipidemia 07/22/2015  . Hypochloremia 07/22/2015  . Type 2 diabetes mellitus with hyperglycemia ([place]) 07/06/2015  . Essential hypertension, benign 07/06/2015  . Medication monitoring encounter 07/06/2015  . Chronic left flank pain 07/06/2015  . Arthritis of both knees 07/06/2015  . Psoriasis, guttate 07/06/2015  . Post-menopausal 07/06/2015  . Gait abnormality 07/06/2015  . DJD (degenerative joint disease) of knee 07/16/2013  . Knee pain, bilateral 07/16/2013    Past Medical History: Past Medical History:  Diagnosis Date  . Carotid atherosclerosis 2017  . Diabetes mellitus without complication ([place])   . DJD (degenerative joint disease)   . History of ovarian cancer    she states approximately 60 years ago.  Marland Kitchen Hyperlipidemia   . Hypertension     Past Surgical History: Past Surgical History:  Procedure Laterality Date  . ABDOMINAL HYSTERECTOMY  partial, due to ovarian cancer  . APPENDECTOMY    . CATARACT EXTRACTION      Family History: Family History  Problem Relation Age of Onset  . Cancer Son 75    stomach    Social History: Social History   Social History  . Marital status: Widowed    Spouse  name: N/A  . Number of children: N/A  . Years of education: N/A   Occupational History  . Not on file.   Social History Main Topics  . Smoking status: Never Smoker  . Smokeless tobacco: Never Used  . Alcohol use No  . Drug use: No  . Sexual activity: Not on file   Other Topics Concern  . Not on file   Social History Narrative  . No narrative on file    Allergies: No Known Allergies  Current Medications: Current Outpatient Prescriptions  Medication Sig Dispense Refill  . aspirin EC 81 MG tablet Take 1 tablet (81 mg total) by mouth daily. 30 tablet 11  . Blood Glucose Monitoring Suppl (FIFTY50 GLUCOSE METER 2.0) w/Device KIT     . glipiZIDE (GLUCOTROL) 10 MG tablet Take by mouth.    . linagliptin (TRADJENTA) 5 MG TABS tablet Take by mouth.    . losartan (COZAAR) 100 MG tablet Take 1 tablet (100 mg total) by mouth daily. 90 tablet 1  . metFORMIN (GLUCOPHAGE) 1000 MG tablet TK 1 T PO BID WITH MEALS  3  . pantoprazole (PROTONIX) 40 MG tablet Take 1 tablet (40 mg total) by mouth daily. 90 tablet 0  . pravastatin (PRAVACHOL) 20 MG tablet Take 1 tablet (20 mg total) by mouth at bedtime. 90 tablet 1   No current facility-administered medications for this visit.     Review of Systems Per interval history.   Objective:  Physical Examination:  BP (!) 163/84 (BP Location: Left Arm, Patient Position: Sitting)   Pulse 96   Temp 98.6 F (37 C) (Oral)   Wt 140 lb 1.6 oz (63.6 kg)   BMI 25.63 kg/m    ECOG Performance Status: 1 - Symptomatic but completely ambulatory  General appearance: alert, cooperative and appears stated age HEENT:PERRLA, neck supple with midline trachea and thyroid without masses Lymph node survey: non-palpable, axillary, inguinal, supraclavicular Cardiovascular: regular rate and rhythm, no murmurs or gallops Respiratory: normal air entry, lungs clear to auscultation Breast exam: not examined. Abdomen: soft, non-tender, without masses or organomegaly, no  hernias and well healed incision.  1 cm lipoma in left mid abdominal wall. Back: inspection of back is normal Extremities: extremities normal, atraumatic, no cyanosis or edema Skin exam - normal coloration and turgor, no rashes, no suspicious skin lesions noted. Neurological exam reveals alert, oriented, normal speech, no focal findings or movement disorder noted.  Pelvic: exam chaperoned by nurse;  Vulva: normal appearing vulva with no masses, tenderness or lesions; Vagina: normal vagina; Adnexa: fullness in midline, nontender and no masses; Rectal: confirms    Assessment:  Tasha Rivera is a 83 y.o. female diagnosed with multicystic pelvic mass that has increased from about 7 to 10 cm in 8 months.  She declined surgery in 5/17 due to advanced age and lack of symptoms or findings concerning for cancer. She is essentially asymptomatic and still does not want surgery.    Medical co-morbidities complicating care: advanced age.  Plan:   Problem List Items Addressed This Visit    None    Visit Diagnoses    Pelvic mass    -  Primary     We again discussed options for management including surgery versus expectant management with the family and an interpreter.  She would like to avoid surgery if at all possible.  This is reasonable given the slow growth of the mass, minimal symptoms and her advanced age.  Told them I could not guarantee that the mass was not malignant, but that her clinical course was not consistent with an aggressive cancer.  She would like to come back to see [place] again in 6 months with CT scan to look for progression.   The patient's diagnosis, an outline of the further diagnostic and laboratory studies which will be required, the recommendation, and alternatives were discussed with the patient and her daughter with an interpreter.  All questions were answered to the patient and family's satisfaction.  Mellody Drown, MD  CC:  Arnetha Courser, [place]  [place], [place] [address] [phone]

## 2016-12-01 ENCOUNTER — Ambulatory Visit: Payer: Self-pay | Admitting: Family Medicine

## 2016-12-13 DIAGNOSIS — E1165 Type 2 diabetes mellitus with hyperglycemia: Secondary | ICD-10-CM | POA: Diagnosis not present

## 2016-12-13 DIAGNOSIS — E118 Type 2 diabetes mellitus with unspecified complications: Secondary | ICD-10-CM | POA: Diagnosis not present

## 2016-12-15 ENCOUNTER — Encounter: Payer: Self-pay | Admitting: Family Medicine

## 2016-12-15 ENCOUNTER — Ambulatory Visit (INDEPENDENT_AMBULATORY_CARE_PROVIDER_SITE_OTHER): Payer: Medicare Other | Admitting: Family Medicine

## 2016-12-15 DIAGNOSIS — I6523 Occlusion and stenosis of bilateral carotid arteries: Secondary | ICD-10-CM

## 2016-12-15 DIAGNOSIS — R1011 Right upper quadrant pain: Secondary | ICD-10-CM

## 2016-12-15 DIAGNOSIS — E1165 Type 2 diabetes mellitus with hyperglycemia: Secondary | ICD-10-CM | POA: Diagnosis not present

## 2016-12-15 DIAGNOSIS — G8929 Other chronic pain: Secondary | ICD-10-CM | POA: Insufficient documentation

## 2016-12-15 DIAGNOSIS — I10 Essential (primary) hypertension: Secondary | ICD-10-CM | POA: Diagnosis not present

## 2016-12-15 DIAGNOSIS — R1900 Intra-abdominal and pelvic swelling, mass and lump, unspecified site: Secondary | ICD-10-CM

## 2016-12-15 DIAGNOSIS — E785 Hyperlipidemia, unspecified: Secondary | ICD-10-CM | POA: Diagnosis not present

## 2016-12-15 LAB — CBC WITH DIFFERENTIAL/PLATELET
BASOS PCT: 0 %
Basophils Absolute: 0 cells/uL (ref 0–200)
EOS ABS: 282 {cells}/uL (ref 15–500)
EOS PCT: 3 %
HCT: 34.2 % — ABNORMAL LOW (ref 35.0–45.0)
Hemoglobin: 11.2 g/dL — ABNORMAL LOW (ref 11.7–15.5)
Lymphocytes Relative: 31 %
Lymphs Abs: 2914 cells/uL (ref 850–3900)
MCH: 29.9 pg (ref 27.0–33.0)
MCHC: 32.7 g/dL (ref 32.0–36.0)
MCV: 91.2 fL (ref 80.0–100.0)
MONOS PCT: 9 %
MPV: 10.6 fL (ref 7.5–12.5)
Monocytes Absolute: 846 cells/uL (ref 200–950)
NEUTROS ABS: 5358 {cells}/uL (ref 1500–7800)
Neutrophils Relative %: 57 %
PLATELETS: 250 10*3/uL (ref 140–400)
RBC: 3.75 MIL/uL — AB (ref 3.80–5.10)
RDW: 12.7 % (ref 11.0–15.0)
WBC: 9.4 10*3/uL (ref 3.8–10.8)

## 2016-12-15 NOTE — Assessment & Plan Note (Addendum)
Check labs today; scan follow-up through specialist; she does not want surgery at this time, wishes to follow and leave in God's hands

## 2016-12-15 NOTE — Progress Notes (Signed)
BP 110/62   Pulse 87   Temp 98.1 F (36.7 C) (Oral)   Resp 14   Wt 139 lb 6.4 oz (63.2 kg)   SpO2 95%   BMI 25.50 kg/m    Subjective:    Patient ID: Tasha Rivera, female    DOB: 1924-03-29, 83 y.o.   MRN: [id number]  HPI: Tasha Rivera is a 83 y.o. female  Chief Complaint  Patient presents with  . Follow-up   Patient is here with her daughter and granddaughter and [place]-speaking interpreter Type 2 diabetes; managed by endocrinologist Sugars go up with fruits; controlled if not eating fruits  She has abdominal upset, diarrhea; not made worse with certain foods Saw GYN about enlarging mass Going to have another scan after 6 months; Dr. Johnnette Litter has ordered another CT scan for July She is doing some [place] medicine now, radish soup,   US done 10/31/2016 Complex septated cystic mass measuring 10.0 x 7.1 x 9.1 cm. This is increased in size from prior CT of 02/23/2016 . Cystic neoplasm again could present this fashion. Repeat abdominal and pelvic CT scan can be obtained for comparison purposes.  IMPRESSION: 10.0 x 7.1 x 9.1 cm complex cystic pelvic mass. This is increased in size from prior CT of 02/23/2016 . This is worrisome for cystic neoplasm. Repeat abdominal and pelvic CT scan for comparison purposes can be obtained.  Electronically Signed   By: Maisie Fus  Register   On: 10/31/2016 13:31  She is a woman of faith and does not want surgery at this time; is putting it in God's hands  Depression screen [facility] 2/9 12/15/2016 10/31/2016 07/21/2016 04/25/2016 02/16/2016  Decreased Interest 0 0 0 0 0  Down, Depressed, Hopeless 0 0 1 0 0  PHQ - 2 Score 0 0 1 0 0   Relevant past medical, surgical, family and social history reviewed Past Medical History:  Diagnosis Date  . Carotid atherosclerosis 2017  . Diabetes mellitus without complication ([facility])   . DJD (degenerative joint disease)   . History of ovarian cancer    she states approximately 60 years ago.  Marland Kitchen Hyperlipidemia   .  Hypertension    Past Surgical History:  Procedure Laterality Date  . ABDOMINAL HYSTERECTOMY     partial, due to ovarian cancer  . APPENDECTOMY    . CATARACT EXTRACTION     Family History  Problem Relation Age of Onset  . Cancer Son 63    stomach   Social History  Substance Use Topics  . Smoking status: Never Smoker  . Smokeless tobacco: Never Used  . Alcohol use No   Interim medical history since last visit reviewed. Allergies and medications reviewed  Review of Systems Per HPI unless specifically indicated above     Objective:    BP 110/62   Pulse 87   Temp 98.1 F (36.7 C) (Oral)   Resp 14   Wt 139 lb 6.4 oz (63.2 kg)   SpO2 95%   BMI 25.50 kg/m   Wt Readings from Last 3 Encounters:  12/15/16 139 lb 6.4 oz (63.2 kg)  11/29/16 140 lb 1.6 oz (63.6 kg)  11/14/16 137 lb (62.1 kg)    Physical Exam  Constitutional: She appears well-developed and well-nourished.  HENT:  Head: Normocephalic and atraumatic.  Mouth/Throat: Mucous membranes are normal.  Eyes: EOM are normal. No scleral icterus.  Cardiovascular: Normal rate and regular rhythm.   Pulmonary/Chest: Effort normal and breath sounds normal.  Abdominal: She exhibits no distension.  There is no rigidity and no guarding.  Fullness and mild tenderness along left lower quadrant, left colic gutter; unchanged from last visit  Neurological: She is alert. She displays no tremor.  Seated in wheelchair; gait not assessed  Skin: Skin is warm. She is not diaphoretic. No pallor.  No jaundice  Psychiatric: She has a normal mood and affect. Her behavior is normal. Her mood appears not anxious. Cognition and memory are not impaired. She does not exhibit a depressed mood.  Very pleasant   Diabetic Foot Form - Detailed   Diabetic Foot Exam - detailed Diabetic Foot exam was performed with the following findings:  Yes 12/15/2016 12:32 PM  Visual Foot Exam completed.:  Yes  Are the toenails ingrown?:  No Normal Range of  Motion:  Yes Pulse Foot Exam completed.:  Yes  Right Dorsalis Pedis:  Present Left Dorsalis Pedis:  Present  Sensory Foot Exam Completed.:  Yes Swelling:  No Semmes-Weinstein Monofilament Test R Site 1-Great Toe:  Pos L Site 1-Great Toe:  Pos  R Site 4:  Pos L Site 4:  Pos  R Site 5:  Pos L Site 5:  Pos          Assessment & Plan:   Problem List Items Addressed This Visit      Cardiovascular and Mediastinum   Essential hypertension, benign    Well-controlled today; continue the ARB      Carotid atherosclerosis    On aspirin and statin        Endocrine   Type 2 diabetes mellitus with hyperglycemia (HCC)    Foot exam by MD today; meds managed by endocrinologist; she knows which foods run her sugars up        Other   Pelvic mass in female    Seeing specialist; suspect low-grade cancer, but patient wishes to wait and watch; f/u scan through specialist      Hyperlipidemia    Continue statin      Chronic RUQ pain    Check labs today; scan follow-up through specialist; she does not want surgery at this time, wishes to follow and leave in God's hands      Relevant Orders   CBC with Differential/Platelet (Completed)   COMPLETE METABOLIC PANEL WITH GFR (Completed)       Follow up plan: Return in about 6 months (around 06/14/2017) for 40 minute visit with interpreter.  An after-visit summary was printed and given to the patient at check-out.  Please see the patient instructions which may contain other information and recommendations beyond what is mentioned above in the assessment and plan.  No orders of the defined types were placed in this encounter.   Orders Placed This Encounter  Procedures  . CBC with Differential/Platelet  . COMPLETE METABOLIC PANEL WITH GFR   Face-to-face time with patient was more than 40 minutes, >50% time spent counseling and coordination of care Extra time was needed today because of the use of interpreter services, review of  records

## 2016-12-15 NOTE — Patient Instructions (Addendum)
You can talk with your [place] doctor about milk thistle We'll get labs today Try compression stockings on both legs Do not wear them at night  God bless you!  ?? ??

## 2016-12-16 LAB — COMPLETE METABOLIC PANEL WITH GFR
ALT: 16 U/L (ref 6–29)
AST: 16 U/L (ref 10–35)
Albumin: 4 g/dL (ref 3.6–5.1)
Alkaline Phosphatase: 60 U/L (ref 33–130)
BILIRUBIN TOTAL: 0.3 mg/dL (ref 0.2–1.2)
BUN: 24 mg/dL (ref 7–25)
CHLORIDE: 103 mmol/L (ref 98–110)
CO2: 24 mmol/L (ref 20–31)
CREATININE: 0.85 mg/dL (ref 0.60–0.88)
Calcium: 9.6 mg/dL (ref 8.6–10.4)
GFR, Est African American: 70 mL/min (ref >=60)
GFR, Est Non African American: 61 mL/min (ref >=60)
GLUCOSE: 139 mg/dL — AB (ref 65–99)
Potassium: 4.5 mmol/L (ref 3.5–5.3)
SODIUM: 138 mmol/L (ref 135–146)
TOTAL PROTEIN: 7.1 g/dL (ref 6.1–8.1)

## 2016-12-24 NOTE — Assessment & Plan Note (Signed)
On aspirin and statin °

## 2016-12-24 NOTE — Assessment & Plan Note (Signed)
Foot exam by MD today; meds managed by endocrinologist; she knows which foods run her sugars up

## 2016-12-24 NOTE — Assessment & Plan Note (Signed)
Continue statin. 

## 2016-12-24 NOTE — Assessment & Plan Note (Signed)
Well-controlled today; continue the ARB

## 2016-12-24 NOTE — Assessment & Plan Note (Signed)
Seeing specialist; suspect low-grade cancer, but patient wishes to wait and watch; f/u scan through specialist

## 2016-12-27 ENCOUNTER — Other Ambulatory Visit: Payer: Self-pay | Admitting: Family Medicine

## 2016-12-28 NOTE — Telephone Encounter (Signed)
Cr and K+ reviewed Feb 2018; Rx approved

## 2017-01-02 DIAGNOSIS — I10 Essential (primary) hypertension: Secondary | ICD-10-CM | POA: Diagnosis not present

## 2017-01-02 DIAGNOSIS — M810 Age-related osteoporosis without current pathological fracture: Secondary | ICD-10-CM | POA: Diagnosis not present

## 2017-01-02 DIAGNOSIS — E118 Type 2 diabetes mellitus with unspecified complications: Secondary | ICD-10-CM | POA: Diagnosis not present

## 2017-01-02 DIAGNOSIS — K219 Gastro-esophageal reflux disease without esophagitis: Secondary | ICD-10-CM | POA: Diagnosis not present

## 2017-01-02 DIAGNOSIS — E1165 Type 2 diabetes mellitus with hyperglycemia: Secondary | ICD-10-CM | POA: Diagnosis not present

## 2017-01-02 DIAGNOSIS — E782 Mixed hyperlipidemia: Secondary | ICD-10-CM | POA: Diagnosis not present

## 2017-01-11 ENCOUNTER — Other Ambulatory Visit: Payer: Self-pay | Admitting: Family Medicine

## 2017-03-02 IMAGING — CT CT ABD-PELV W/ CM
1 of 3 series · 13 of 32 positions shown, 18 images · IV contrast (APPLIED)
Comparison: 11/30/2015

CLINICAL DATA: F/U CT A/P 11/30/2015. She states she just has pelvic
cramping. NKI. Hx Ovarian CA with Hysterectomy. Hx Appendectomy.

EXAM:
CT ABDOMEN AND PELVIS WITH CONTRAST
TECHNIQUE: Multidetector CT imaging of the abdomen and pelvis was performed
using the standard protocol following bolus administration of
intravenous contrast.
CONTRAST:  100mL ZHA80P-1BB IOPAMIDOL (ZHA80P-1BB) INJECTION 61%

[Series 2: axial st · axial · 0.79mm/px · z∈[-937,-542]mm · 13 of 89 slices shown, 18 images]
[im 5/89  soft-tissue]
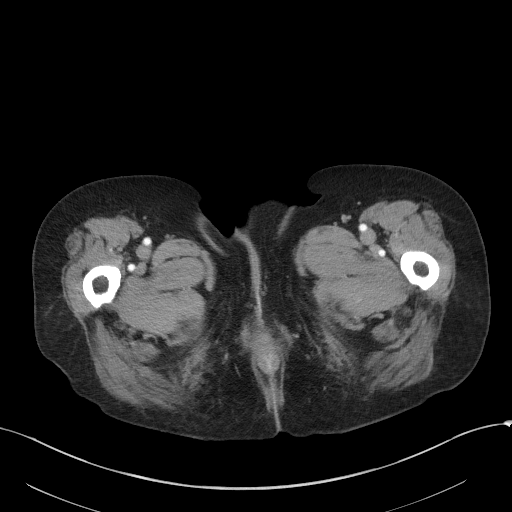
[im 5/89  bone]
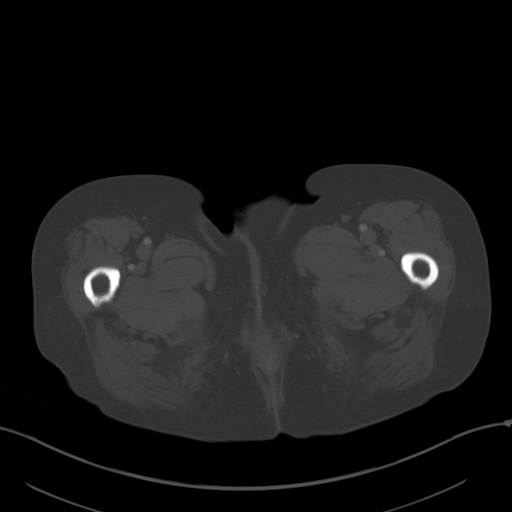
[im 14/89  soft-tissue]
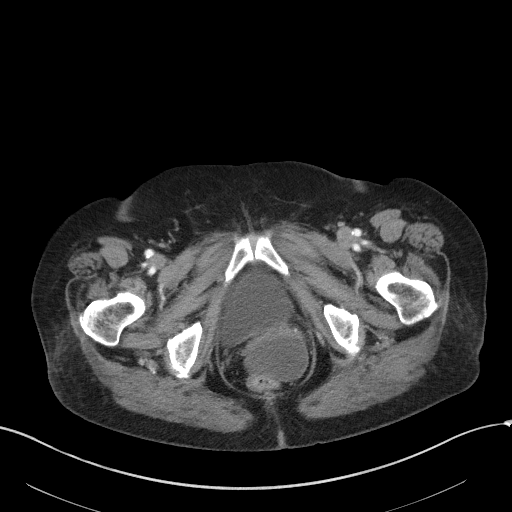
[im 19/89  soft-tissue]
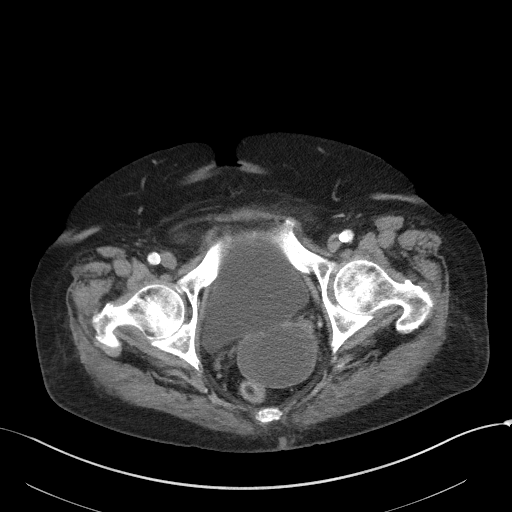
[im 28/89  soft-tissue]
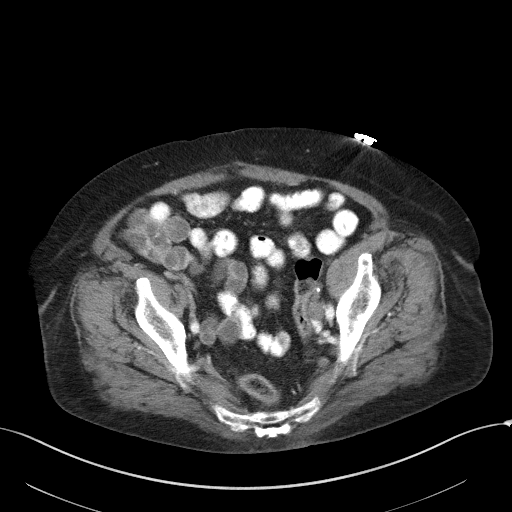
[im 33/89  soft-tissue]
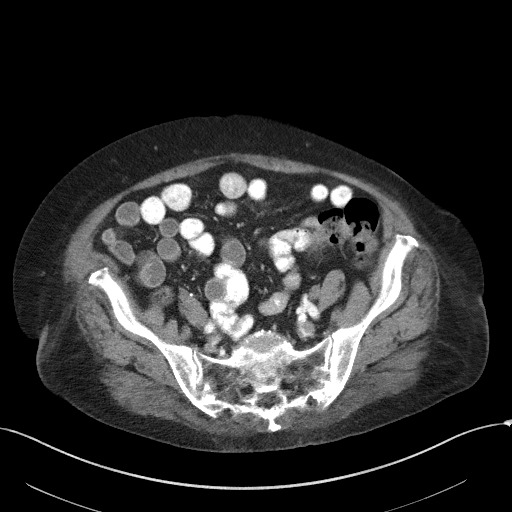
[im 42/89  soft-tissue]
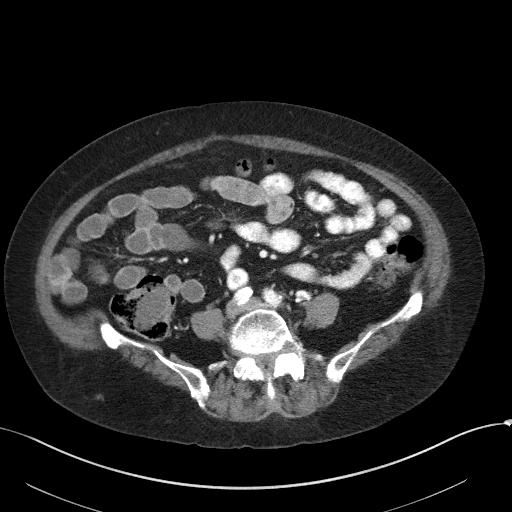
[im 47/89  soft-tissue]
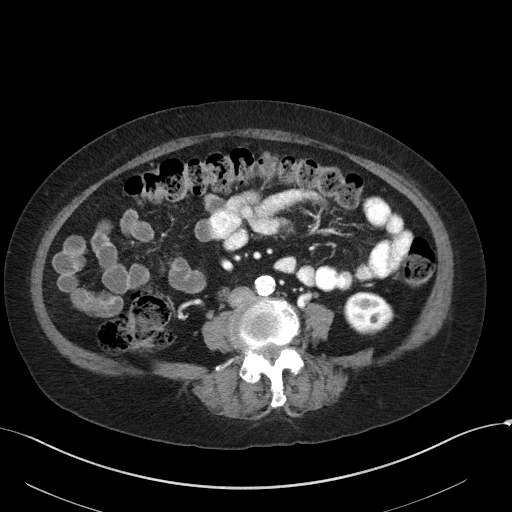
[im 56/89  soft-tissue]
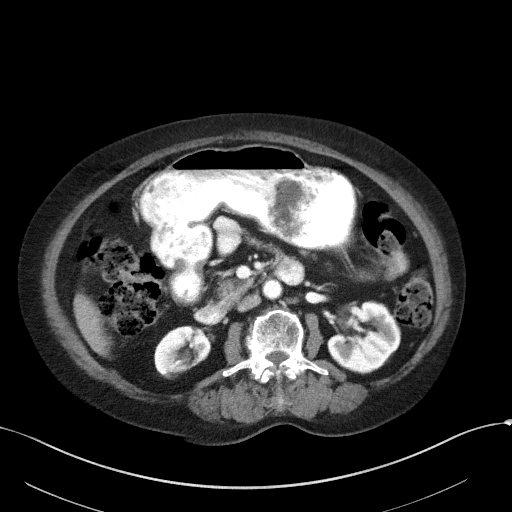
[im 61/89  soft-tissue]
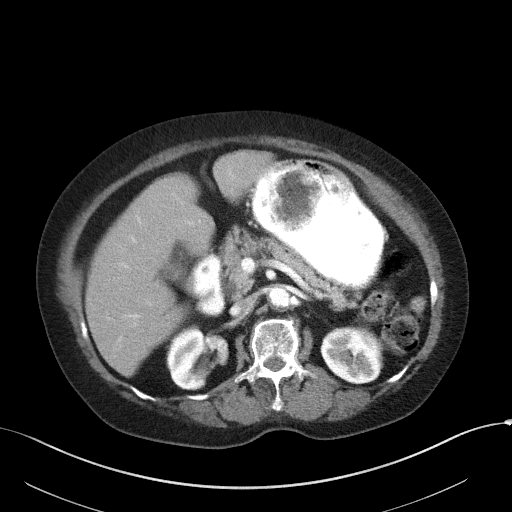
[im 61/89  bone]
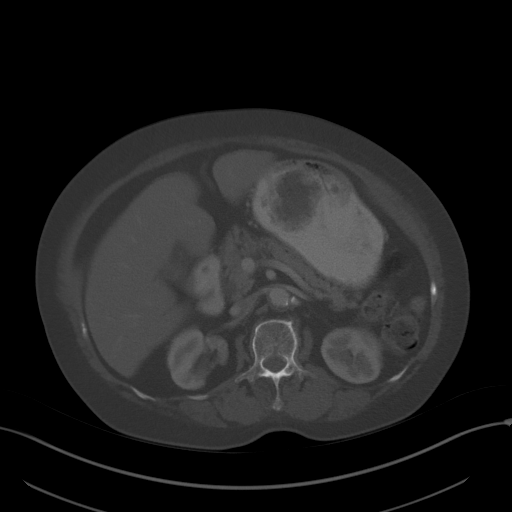
[im 70/89  soft-tissue]
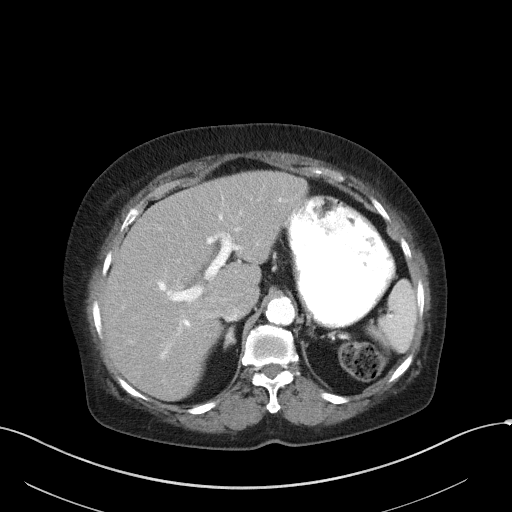
[im 70/89  lung]
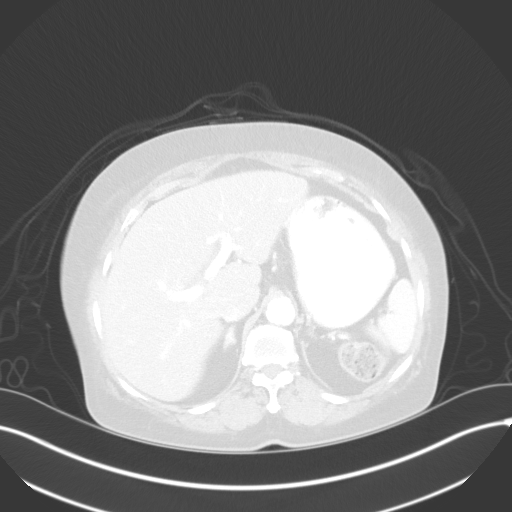
[im 75/89  soft-tissue]
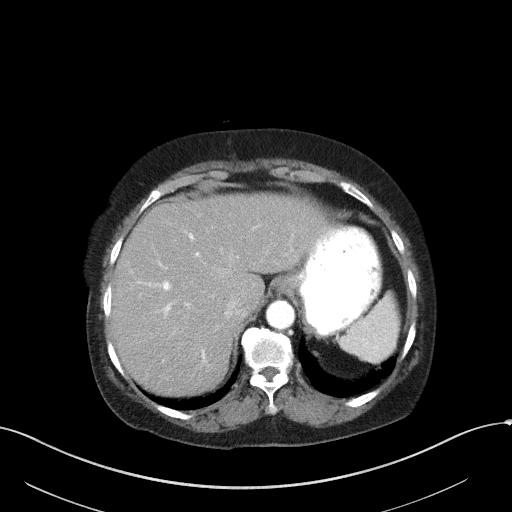
[im 75/89  lung]
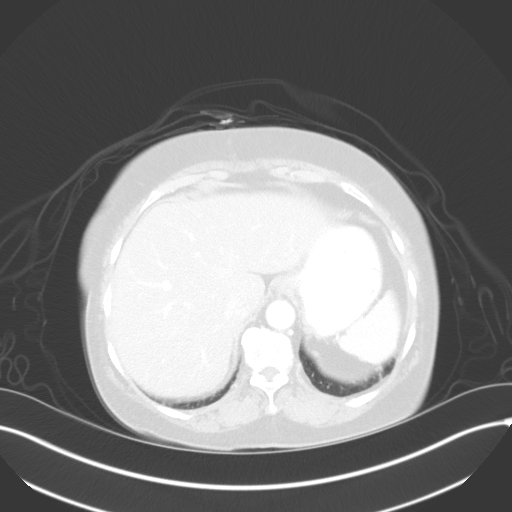
[im 79/89  lung]
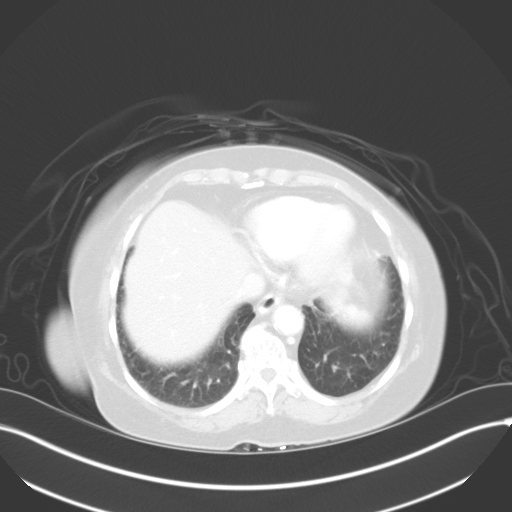
[im 84/89  soft-tissue]
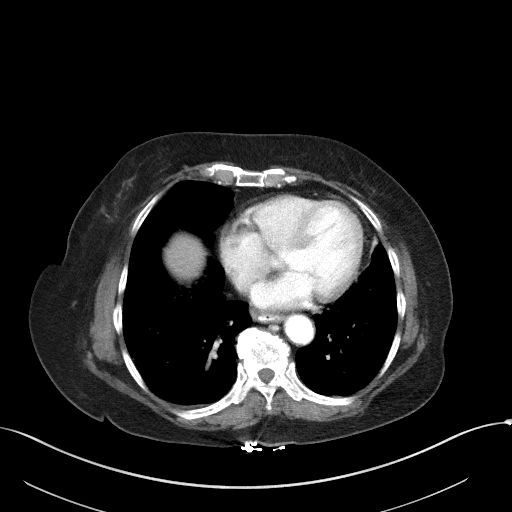
[im 84/89  lung]
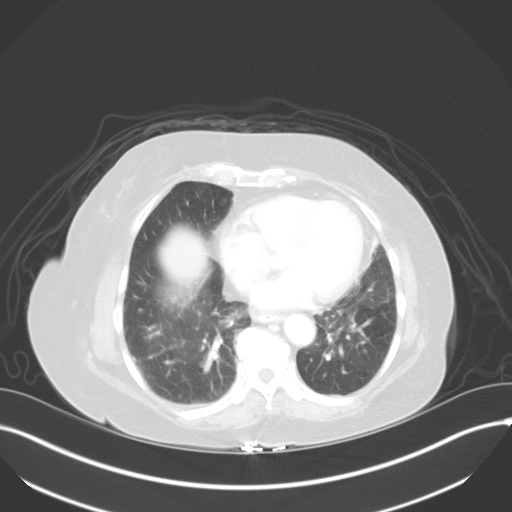

[13 of 32 positions shown; findings below may reference images not displayed]

FINDINGS: Lung bases: Minor subsegmental atelectasis. No masses or nodules.
Heart normal in size.

Hepatobiliary: Fatty infiltration of the liver. Liver normal in
size. No mass or focal lesion. Gallbladder and biliary tree are
unremarkable.

Spleen, pancreas, adrenal glands:  Normal.

Kidneys, ureters, bladder: Area of renal scarring along the
posterior aspect of the right kidney. Small low-density lesions in
the left kidney and 1 from the upper pole the right kidney, all
consistent with cysts and stable. Calcification in the left renal
hilum that is likely vascular. No hydronephrosis. Ureters normal
course and in caliber. Bladder is unremarkable.

Uterus and adnexa: Uterus surgically absent. Cystic mass lies
posterior to the bladder in the cul-de-sac. There is a focus of
dystrophic appearing calcification along its inferior margin. The
cystic mass measures 7.3 x 5.2 x 6.8 cm, previously 6.9 x 5.0 x
cm. The dystrophic appearing calcification along the inferior margin
of the cystic mass is new since the prior exam.

Lymph nodes:  No adenopathy.

Ascites: None.

Gastrointestinal: Stomach, small bowel and colon are unremarkable.
Appendix has been removed.

Musculoskeletal: Degenerative changes of the visualized spine. Bones
are demineralized. Small sclerotic focus in the T12 vertebrae is
likely a bone island. It is stable. There are no osteo lytic
lesions.
IMPRESSION: 1. Cystic mass in the pelvis has mildly increased in size, now
measuring 7.3 x 5.2 x 6.8 cm, previously 6.9 x 5.0 x 6.4 cm. There
is new dystrophic appearing calcification along its inferior margin.
A cystic neoplasm is suspected given the mild interval growth. This
still could reflect a paraovarian cyst or inclusion cyst.
2. There is no CT evidence of locally invasive carcinoma or of
metastatic disease.
3. No acute findings.
4. Hepatic steatosis.
5. Stable right renal scarring.  Small renal cysts, also stable.

## 2017-03-27 DIAGNOSIS — E1165 Type 2 diabetes mellitus with hyperglycemia: Secondary | ICD-10-CM | POA: Diagnosis not present

## 2017-03-27 DIAGNOSIS — E118 Type 2 diabetes mellitus with unspecified complications: Secondary | ICD-10-CM | POA: Diagnosis not present

## 2017-05-22 ENCOUNTER — Encounter (INDEPENDENT_AMBULATORY_CARE_PROVIDER_SITE_OTHER): Payer: Self-pay | Admitting: Vascular Surgery

## 2017-05-22 ENCOUNTER — Ambulatory Visit (INDEPENDENT_AMBULATORY_CARE_PROVIDER_SITE_OTHER): Payer: Medicare Other | Admitting: Vascular Surgery

## 2017-05-22 ENCOUNTER — Ambulatory Visit (INDEPENDENT_AMBULATORY_CARE_PROVIDER_SITE_OTHER): Payer: Medicare Other

## 2017-05-22 VITALS — BP 149/58 | HR 68 | Resp 16

## 2017-05-22 DIAGNOSIS — I6523 Occlusion and stenosis of bilateral carotid arteries: Secondary | ICD-10-CM | POA: Diagnosis not present

## 2017-05-22 DIAGNOSIS — E785 Hyperlipidemia, unspecified: Secondary | ICD-10-CM | POA: Diagnosis not present

## 2017-05-22 DIAGNOSIS — M47812 Spondylosis without myelopathy or radiculopathy, cervical region: Secondary | ICD-10-CM

## 2017-05-22 DIAGNOSIS — M4692 Unspecified inflammatory spondylopathy, cervical region: Secondary | ICD-10-CM

## 2017-05-22 DIAGNOSIS — I10 Essential (primary) hypertension: Secondary | ICD-10-CM

## 2017-05-22 DIAGNOSIS — E1165 Type 2 diabetes mellitus with hyperglycemia: Secondary | ICD-10-CM

## 2017-05-22 NOTE — Progress Notes (Signed)
MRN : [id number]  Tasha Rivera is a 83 y.o. (05-18-1924) female who presents with chief complaint of  Chief Complaint  Patient presents with  . Carotid    6 month u/s follow up  .  History of Present Illness: Patient returns in follow-up of her carotid disease. The history is obtained from an interpreter and her daughter provides much of the history. She still has intermittent neck pain and multiple areas of arthritis which are troublesome to her. She walks a little and apparently had a fall yesterday. She has not had any focal neurologic symptoms of cerebrovascular ischemia.Specifically, the patient denies amaurosis fugax, speech or swallowing difficulties, or arm or leg weakness or numbness. Her carotid duplex today shows stable stenosis on the right and 1-39% range but significant progression in the left carotid artery now measuring with velocities in the 80-99% range. This is a marked worsening from her duplex 6 months ago.  Past Medical History:  Diagnosis Date  . Carotid atherosclerosis 2017  . Diabetes mellitus without complication ([place])   . DJD (degenerative joint disease)   . History of ovarian cancer    she states approximately 60 years ago.  Marland Kitchen Hyperlipidemia   . Hypertension          Past Surgical History:  Procedure Laterality Date  . ABDOMINAL HYSTERECTOMY     partial, due to ovarian cancer  . APPENDECTOMY    . CATARACT EXTRACTION            Family History  Problem Relation Age of Onset  . Cancer Son 40    stomach  No bleeding disorders, clotting disorders, or autoimmune diseases  Social History     Social History  Substance Use Topics  . Smoking status: Never Smoker  . Smokeless tobacco: Never Used  . Alcohol use No  She was a Company secretary.  No Known Allergies        Current Outpatient Prescriptions  Medication Sig Dispense Refill  . aspirin EC 81 MG tablet Take 1 tablet (81 mg total) by mouth daily. 30 tablet 11  . Blood Glucose  Monitoring Suppl (FIFTY50 GLUCOSE METER 2.0) w/Device KIT     . glipiZIDE (GLUCOTROL) 10 MG tablet Take by mouth.    . linagliptin (TRADJENTA) 5 MG TABS tablet Take by mouth.    . losartan (COZAAR) 100 MG tablet Take 1 tablet (100 mg total) by mouth daily. 90 tablet 1  . metFORMIN (GLUCOPHAGE) 1000 MG tablet TK 1 T PO BID WITH MEALS  3  . pantoprazole (PROTONIX) 40 MG tablet Take 1 tablet (40 mg total) by mouth daily. 90 tablet 0  . pravastatin (PRAVACHOL) 20 MG tablet Take 1 tablet (20 mg total) by mouth at bedtime. 90 tablet 1   No current facility-administered medications for this visit.       REVIEW OF SYSTEMS (Negative unless checked)  Constitutional: [] Weight loss  [] Fever  [] Chills Cardiac: [] Chest pain   [] Chest pressure   [] Palpitations   [] Shortness of breath when laying flat   [] Shortness of breath at rest   [] Shortness of breath with exertion. Vascular:  [] Pain in legs with walking   [] Pain in legs at rest   [] Pain in legs when laying flat   [] Claudication   [] Pain in feet when walking  [] Pain in feet at rest  [] Pain in feet when laying flat   [] History of DVT   [] Phlebitis   [] Swelling in legs   [] Varicose veins   [] Non-healing ulcers  Pulmonary:   [] Uses home oxygen   [] Productive cough   [] Hemoptysis   [] Wheeze  [] COPD   [] Asthma Neurologic:  [] Dizziness  [] Blackouts   [] Seizures   [] History of stroke   [] History of TIA  [] Aphasia   [] Temporary blindness   [] Dysphagia   [] Weakness or numbness in arms   [] Weakness or numbness in legs Musculoskeletal:  [x] Arthritis   [] Joint swelling   [x] Joint pain   [x] Low back pain Hematologic:  [] Easy bruising  [] Easy bleeding   [] Hypercoagulable state   [] Anemic  [] Hepatitis Gastrointestinal:  [] Blood in stool   [] Vomiting blood  [] Gastroesophageal reflux/heartburn   [] Abdominal pain Genitourinary:  [] Chronic kidney disease   [] Difficult urination  [] Frequent urination  [] Burning with urination   [] Hematuria Skin:  [] Rashes    [] Ulcers   [] Wounds Psychological:  [] History of anxiety   []  History of major depression.    Physical Examination  Vitals:   05/22/17 1600  BP: (!) 149/58  Pulse: 68  Resp: 16   There is no height or weight on file to calculate BMI. Gen:  WD/WN, NAD Head: [place]/AT, No temporalis wasting. Ear/Nose/Throat: Hearing Is diminished, nares w/o erythema or drainage, trachea midline Eyes: Conjunctiva clear. Sclera non-icteric Neck: Supple.  No JVD. Left carotid bruit is present Pulmonary:  Good air movement, equal and clear to auscultation bilaterally.  Cardiac: RRR, normal S1, S2 Vascular:  Vessel Right Left  Radial Palpable Palpable                                    Musculoskeletal: M/S 5/5 throughout.  Uses a wheelchair. Significant arthritic changes are present Neurologic: CN 2-12 intact. Sensation grossly intact in extremities.  Symmetrical.  Speech is fluent. Motor exam as listed above. Psychiatric: Judgment intact, Mood & affect appropriate for pt's clinical situation. Dermatologic: No rashes or ulcers noted.  No cellulitis or open wounds.      CBC Lab Results  Component Value Date   WBC 9.4 12/15/2016   HGB 11.2 (L) 12/15/2016   HCT 34.2 (L) 12/15/2016   MCV 91.2 12/15/2016   PLT 250 12/15/2016    BMET    Component Value Date/Time   NA 138 12/15/2016 1557   NA 133 (L) 11/29/2015 1542   K 4.5 12/15/2016 1557   CL 103 12/15/2016 1557   CO2 24 12/15/2016 1557   GLUCOSE 139 (H) 12/15/2016 1557   BUN 24 12/15/2016 1557   BUN 15 11/29/2015 1542   CREATININE 0.85 12/15/2016 1557   CALCIUM 9.6 12/15/2016 1557   GFRNONAA 61 12/15/2016 1557   GFRAA 70 12/15/2016 1557   CrCl cannot be calculated (Patient's most recent lab result is older than the maximum 21 days allowed.).  COAG No results found for: INR, PROTIME  Radiology No results found.    Assessment/Plan Type 2 diabetes mellitus with hyperglycemia blood glucose control important in reducing  the progression of atherosclerotic disease. Also, involved in wound healing. On appropriate medications.   Essential hypertension, benign blood pressure control important in reducing the progression of atherosclerotic disease. On appropriate oral medications.   Hyperlipidemia lipid control important in reducing the progression of atherosclerotic disease. Continue statin therapy   Neck pain, chronic This is unlikely related to her neck pain. This is more likely related to arthritis or disc disease in her neck.  Carotid stenosis Her carotid duplex today shows stable stenosis on the right and 1-39% range  but significant progression in the left carotid artery now measuring with velocities in the 80-99% range. This is a marked worsening from her duplex 6 months ago. This is a difficult situation. Given her marked progression and now clearly having duplex criteria that would suggest high-grade stenosis, intervention must be considered. Given her advanced age and comorbidities, intervention would be a difficult prospect. After a long discussion with she and her daughter today, she is adamant that she does not one to have any surgery, stenting, or other procedure done. She seems reasonably clear and this desire. I have discussed her annual risk of stroke of several percent. I have discussed the risks of both surgery and stenting if these were to be performed. At this time, I will honor her wishes and not plan any further testing or evaluation if we are not going to consider intervention at this point. No follow-up will be planned at this time, but should she have further questions or have further consideration for intervention and I will be happy to see her back in any time. She should continue her aspirin and her pravastatin as medical therapy. I am not going to give her Plavix because she does have significant fall risk.    Leotis Pain, MD  05/22/2017 4:47 PM    This note was created with  Dragon medical transcription system.  Any errors from dictation are purely unintentional

## 2017-05-22 NOTE — Assessment & Plan Note (Signed)
Her carotid duplex today shows stable stenosis on the right and 1-39% range but significant progression in the left carotid artery now measuring with velocities in the 80-99% range. This is a marked worsening from her duplex 6 months ago. This is a difficult situation. Given her marked progression and now clearly having duplex criteria that would suggest high-grade stenosis, intervention must be considered. Given her advanced age and comorbidities, intervention would be a difficult prospect. After a long discussion with she and her daughter today, she is adamant that she does not one to have any surgery, stenting, or other procedure done. She seems reasonably clear and this desire. I have discussed her annual risk of stroke of several percent. I have discussed the risks of both surgery and stenting if these were to be performed. At this time, I will honor her wishes and not plan any further testing or evaluation if we are not going to consider intervention at this point. No follow-up will be planned at this time, but should she have further questions or have further consideration for intervention and I will be happy to see her back in any time. She should continue her aspirin and her pravastatin as medical therapy. I am not going to give her Plavix because she does have significant fall risk.

## 2017-05-22 NOTE — Patient Instructions (Signed)
Carotid Artery Disease The carotid arteries are arteries on both sides of the neck. They carry blood to the brain. Carotid artery disease is when the arteries get smaller (narrow) or get blocked. If these arteries get smaller or get blocked, you are more likely to have a stroke or warning stroke (transient ischemic attack). Follow these instructions at home:  Take medicines as told by your doctor. Make sure you understand all your medicine instructions. Do not stop your medicines without talking to your doctor first.  Follow your doctor's diet instructions. It is important to eat a healthy diet that includes plenty of: ? Fresh fruits. ? Vegetables. ? Lean meats.  Avoid: ? High-fat foods. ? High-sodium foods. ? Foods that are fried, overly processed, or have poor nutritional value.  Stay a healthy weight.  Stay active. Get at least 30 minutes of activity every day.  Do not smoke.  Limit alcohol use to: ? No more than 2 drinks a day for men. ? No more than 1 drink a day for women who are not pregnant.  Do not use illegal drugs.  Keep all doctor visits as told. Get help right away if:  You have sudden weakness or loss of feeling (numbness) on one side of the body, such as the face, arm, or leg.  You have sudden confusion.  You have trouble speaking (aphasia) or understanding.  You have sudden trouble seeing out of one or both eyes.  You have sudden trouble walking.  You have dizziness or feel like you might pass out (faint).  You have a loss of balance or your movements are not steady (uncoordinated).  You have a sudden, severe headache with no known cause.  You have trouble swallowing (dysphagia). Call your local emergency services (911 in [place] Do notdrive yourself to the clinic or hospital. This information is not intended to replace advice given to you by your health care provider. Make sure you discuss any questions you have with your health care  provider. Document Released: 10/09/2012 Document Revised: 03/30/2016 Document Reviewed: 04/23/2013 Elsevier Interactive Patient Education  2018 Elsevier Inc.  

## 2017-05-23 ENCOUNTER — Ambulatory Visit: Admission: RE | Admit: 2017-05-23 | Payer: Medicare Other | Source: Ambulatory Visit

## 2017-05-30 ENCOUNTER — Ambulatory Visit: Payer: Medicare Other

## 2017-06-06 ENCOUNTER — Encounter: Payer: Self-pay | Admitting: Obstetrics and Gynecology

## 2017-06-06 ENCOUNTER — Inpatient Hospital Stay: Payer: Medicare Other | Attending: Obstetrics and Gynecology | Admitting: Obstetrics and Gynecology

## 2017-06-06 VITALS — BP 159/82 | HR 92 | Temp 98.1°F | Resp 18 | Ht 62.0 in | Wt 140.5 lb

## 2017-06-06 DIAGNOSIS — I10 Essential (primary) hypertension: Secondary | ICD-10-CM | POA: Diagnosis not present

## 2017-06-06 DIAGNOSIS — E785 Hyperlipidemia, unspecified: Secondary | ICD-10-CM | POA: Diagnosis not present

## 2017-06-06 DIAGNOSIS — Z7982 Long term (current) use of aspirin: Secondary | ICD-10-CM | POA: Insufficient documentation

## 2017-06-06 DIAGNOSIS — I700 Atherosclerosis of aorta: Secondary | ICD-10-CM | POA: Diagnosis not present

## 2017-06-06 DIAGNOSIS — D72829 Elevated white blood cell count, unspecified: Secondary | ICD-10-CM | POA: Diagnosis not present

## 2017-06-06 DIAGNOSIS — K760 Fatty (change of) liver, not elsewhere classified: Secondary | ICD-10-CM | POA: Insufficient documentation

## 2017-06-06 DIAGNOSIS — M170 Bilateral primary osteoarthritis of knee: Secondary | ICD-10-CM | POA: Diagnosis not present

## 2017-06-06 DIAGNOSIS — M5136 Other intervertebral disc degeneration, lumbar region: Secondary | ICD-10-CM | POA: Diagnosis not present

## 2017-06-06 DIAGNOSIS — N281 Cyst of kidney, acquired: Secondary | ICD-10-CM | POA: Insufficient documentation

## 2017-06-06 DIAGNOSIS — I6523 Occlusion and stenosis of bilateral carotid arteries: Secondary | ICD-10-CM | POA: Diagnosis not present

## 2017-06-06 DIAGNOSIS — M542 Cervicalgia: Secondary | ICD-10-CM | POA: Diagnosis not present

## 2017-06-06 DIAGNOSIS — I2510 Atherosclerotic heart disease of native coronary artery without angina pectoris: Secondary | ICD-10-CM | POA: Diagnosis not present

## 2017-06-06 DIAGNOSIS — E1165 Type 2 diabetes mellitus with hyperglycemia: Secondary | ICD-10-CM | POA: Insufficient documentation

## 2017-06-06 DIAGNOSIS — M4692 Unspecified inflammatory spondylopathy, cervical region: Secondary | ICD-10-CM | POA: Insufficient documentation

## 2017-06-06 DIAGNOSIS — Z8543 Personal history of malignant neoplasm of ovary: Secondary | ICD-10-CM | POA: Diagnosis not present

## 2017-06-06 DIAGNOSIS — R1011 Right upper quadrant pain: Secondary | ICD-10-CM | POA: Insufficient documentation

## 2017-06-06 DIAGNOSIS — E871 Hypo-osmolality and hyponatremia: Secondary | ICD-10-CM | POA: Insufficient documentation

## 2017-06-06 DIAGNOSIS — M545 Low back pain: Secondary | ICD-10-CM | POA: Insufficient documentation

## 2017-06-06 DIAGNOSIS — Z79899 Other long term (current) drug therapy: Secondary | ICD-10-CM | POA: Insufficient documentation

## 2017-06-06 DIAGNOSIS — M1990 Unspecified osteoarthritis, unspecified site: Secondary | ICD-10-CM | POA: Diagnosis not present

## 2017-06-06 DIAGNOSIS — L409 Psoriasis, unspecified: Secondary | ICD-10-CM | POA: Diagnosis not present

## 2017-06-06 DIAGNOSIS — Z7984 Long term (current) use of oral hypoglycemic drugs: Secondary | ICD-10-CM | POA: Insufficient documentation

## 2017-06-06 DIAGNOSIS — I6529 Occlusion and stenosis of unspecified carotid artery: Secondary | ICD-10-CM | POA: Insufficient documentation

## 2017-06-06 DIAGNOSIS — R350 Frequency of micturition: Secondary | ICD-10-CM | POA: Diagnosis not present

## 2017-06-06 DIAGNOSIS — R1900 Intra-abdominal and pelvic swelling, mass and lump, unspecified site: Secondary | ICD-10-CM

## 2017-06-06 NOTE — Progress Notes (Signed)
Pt has some abdominal discomfort and bloating some times , not much and does not need any med for it.

## 2017-06-06 NOTE — Progress Notes (Signed)
Gynecologic Oncology Consult Visit   Referring Provider: Dr Sanda Klein  Chief Concern: pelvic mass  Subjective:  Zaineb Nowaczyk is a 83 y.o. P37 female who is seen in consultation from Dr. Sanda Klein for pelvic mass.  Patient returns today for follow up.  Refuses repeat imaging that was scheduled.  Patient says she no longer has any abdominal pain.  Has some urinary frequency, with voiding every 2-3 hours, but this is not changed in the past 5 years.   Accompanied by daughter and [place] interpreter.   ROS: Night sweats, headaches, hearing loss, rash, leg pain with walking, cough, pain in extremities and back.   Pelvic US 12/17 IMPRESSION: 10.0 x 7.1 x 9.1 cm complex cystic pelvic mass. This is increased in size from prior CT of 02/23/2016 . This is worrisome for cystic neoplasm. Repeat abdominal and pelvic CT scan for comparison purposes can be obtained.   History Patient complained of abdominal pain in 1/17 and CT scan ordered.    She had a hysterectomy and in [place] around age 60, they left one ovary the daughter says; the hysterectomy was for cysts; five hour surgery, no cancer  11/30/15 CT scan IMPRESSION:  Multiloculated complex cystic pelvic mass, abutting the left adnexa. Differential diagnosis includes peritoneal inclusion cyst, paraovarian cyst, low-grade left ovarian malignancy if the left ovary is present. Pelvic abscess although possible is felt unlikely. Surgical consult is recommended.  02/23/16 CT scan IMPRESSION:  1. Cystic mass in the pelvis has mildly increased in size, now measuring 7.3 x 5.2 x 6.8 cm, previously 6.9 x 5.0 x 6.4 cm. There is new dystrophic appearing calcification along its inferior margin. A cystic neoplasm is suspected given the mild interval growth. This still could reflect a paraovarian cyst or inclusion cyst. 2. There is no CT evidence of locally invasive carcinoma or of metastatic disease. 3. No acute findings. 4. Hepatic steatosis. 5. Stable right  renal scarring. Small renal cysts, also stable.    CA125/CEA were normal (11.1/1.7) in May 2017.   Problem List: Patient Active Problem List   Diagnosis Date Noted  . Chronic RUQ pain 12/15/2016  . Carotid stenosis 11/17/2016  . Abdominal pain 10/31/2016  . Carpal tunnel syndrome on right 07/21/2016  . Carotid atherosclerosis 02/18/2016  . Cervical spine arthritis ([place]) 02/18/2016  . Neck pain, chronic 02/16/2016  . Eye dryness 02/16/2016  . Leukocytosis 12/19/2015  . Pelvic mass in female 12/02/2015  . Degenerative disc disease, lumbar 11/29/2015  . Aorto-iliac atherosclerosis ([place]) 11/29/2015  . Degenerative joint disease (DJD) of hip 11/29/2015  . Blurred vision, bilateral 11/22/2015  . Low back pain 10/21/2015  . Pelvic pain in female 10/21/2015  . Osteoporosis 09/26/2015  . Primary osteoarthritis of both knees 09/22/2015  . Shoulder pain, left 07/22/2015  . Hyponatremia 07/22/2015  . Hyperlipidemia 07/22/2015  . Type 2 diabetes mellitus with hyperglycemia ([place]) 07/06/2015  . Essential hypertension, benign 07/06/2015  . Medication monitoring encounter 07/06/2015  . Chronic left flank pain 07/06/2015  . Arthritis of both knees 07/06/2015  . Psoriasis, guttate 07/06/2015  . Post-menopausal 07/06/2015  . Gait abnormality 07/06/2015  . DJD (degenerative joint disease) of knee 07/16/2013    Past Medical History: Past Medical History:  Diagnosis Date  . Carotid atherosclerosis 2017  . Diabetes mellitus without complication ([place])   . DJD (degenerative joint disease)   . History of ovarian cancer    she states approximately 60 years ago.  Marland Kitchen Hyperlipidemia   . Hypertension  Past Surgical History: Past Surgical History:  Procedure Laterality Date  . ABDOMINAL HYSTERECTOMY     partial, due to ovarian cancer  . APPENDECTOMY    . CATARACT EXTRACTION      Family History: Family History  Problem Relation Age of Onset  . Cancer Son 41       stomach     Social History: Social History   Social History  . Marital status: Widowed    Spouse name: N/A  . Number of children: N/A  . Years of education: N/A   Occupational History  . Not on file.   Social History Main Topics  . Smoking status: Never Smoker  . Smokeless tobacco: Never Used  . Alcohol use No  . Drug use: No  . Sexual activity: Not on file   Other Topics Concern  . Not on file   Social History Narrative  . No narrative on file    Allergies: No Known Allergies  Current Medications: Current Outpatient Prescriptions  Medication Sig Dispense Refill  . alendronate (FOSAMAX) 70 MG tablet Take by mouth.    Marland Kitchen amLODipine (NORVASC) 5 MG tablet Take by mouth.    Marland Kitchen aspirin EC 81 MG tablet Take 1 tablet (81 mg total) by mouth daily. 30 tablet 11  . Blood Glucose Monitoring Suppl (FIFTY50 GLUCOSE METER 2.0) w/Device KIT     . diclofenac sodium (VOLTAREN) 1 % GEL APP TOPICALLY 2 GRAMS AA QID  0  . glipiZIDE (GLUCOTROL) 10 MG tablet Take by mouth.    . linagliptin (TRADJENTA) 5 MG TABS tablet Take by mouth.    . losartan (COZAAR) 100 MG tablet TAKE 1 TABLET BY MOUTH EVERY DAY 90 tablet 2  . metFORMIN (GLUCOPHAGE) 1000 MG tablet TK 1 T PO BID WITH MEALS  3  . pantoprazole (PROTONIX) 40 MG tablet Take by mouth.    . pravastatin (PRAVACHOL) 20 MG tablet TAKE 1 TABLET BY MOUTH EVERY NIGHT AT BEDTIME 90 tablet 2   No current facility-administered medications for this visit.     Review of Systems Per interval history.   Objective:  Physical Examination:  BP (!) 159/82   Pulse 92   Temp 98.1 F (36.7 C) (Oral)   Resp 18   Ht _0  (1.575 m)   Wt 140 lb 8 oz (63.7 kg)   BMI 25.70 kg/m    ECOG Performance Status: 2 - Symptomatic, <50% confined to bed  General appearance: alert, cooperative and appears stated age HEENT:PERRLA, neck supple with midline trachea and thyroid without masses Lymph node survey: non-palpable, axillary, inguinal,  supraclavicular Cardiovascular: regular rate and rhythm, no murmurs or gallops Respiratory: normal air entry, lungs clear to auscultation Breast exam: not examined. Abdomen: soft, non-tender, without masses or organomegaly, no hernias and well healed incision.  1 cm lipoma in left mid abdominal wall. Back: inspection of back is normal Extremities: extremities normal, atraumatic, no cyanosis or edema Skin exam - normal coloration and turgor, no rashes, no suspicious skin lesions noted. Neurological exam reveals alert, oriented, normal speech, no focal findings or movement disorder noted.  Pelvic: exam per last visit chaperoned by nurse;  Vulva: normal appearing vulva with no masses, tenderness or lesions; Vagina: normal vagina; Adnexa: normal adnexa in size, nontender and no masses; Rectal: no masses palpable    Assessment:  Savhanna Sliva is a 83 y.o. female diagnosed with multicystic pelvic mass that has increased from about 7 to 10 cm in 8 months, as of 12/17.  She declined surgery due to advanced age and lack of symptoms or findings concerning for cancer. She is essentially asymptomatic and still does not want surgery.   Medical co-morbidities complicating care: advanced age.  Plan:   Problem List Items Addressed This Visit    None    Visit Diagnoses    Pelvic mass    -  Primary     We again discussed options for management including surgery versus expectant management with the family and an interpreter.  She would like to avoid surgery if at all possible.  This is reasonable given the slow growth of the mass, minimal symptoms and her advanced age.  Told them I could not guarantee that the mass was not malignant, but that her clinical course was not consistent with an aggressive cancer.  She would like to come back to see [place] again in 6 months to see me.  Mellody Drown, MD  CC:  Arnetha Courser, MD [address] [place], [place] [address] [phone]

## 2017-06-22 LAB — HM DIABETES EYE EXAM

## 2017-07-20 DIAGNOSIS — E119 Type 2 diabetes mellitus without complications: Secondary | ICD-10-CM | POA: Diagnosis not present

## 2017-08-02 DIAGNOSIS — Z961 Presence of intraocular lens: Secondary | ICD-10-CM | POA: Diagnosis not present

## 2017-08-09 LAB — HEMOGLOBIN A1C: HEMOGLOBIN A1C: 7.5

## 2017-08-11 ENCOUNTER — Other Ambulatory Visit: Payer: Self-pay | Admitting: Family Medicine

## 2017-08-13 ENCOUNTER — Encounter: Payer: Self-pay | Admitting: Family Medicine

## 2017-08-13 ENCOUNTER — Ambulatory Visit (INDEPENDENT_AMBULATORY_CARE_PROVIDER_SITE_OTHER): Payer: Medicare Other | Admitting: Family Medicine

## 2017-08-13 VITALS — BP 138/76 | HR 90 | Temp 98.0°F | Resp 14 | Ht 62.0 in | Wt 138.9 lb

## 2017-08-13 DIAGNOSIS — I700 Atherosclerosis of aorta: Secondary | ICD-10-CM | POA: Diagnosis not present

## 2017-08-13 DIAGNOSIS — R1900 Intra-abdominal and pelvic swelling, mass and lump, unspecified site: Secondary | ICD-10-CM

## 2017-08-13 DIAGNOSIS — I10 Essential (primary) hypertension: Secondary | ICD-10-CM

## 2017-08-13 DIAGNOSIS — L404 Guttate psoriasis: Secondary | ICD-10-CM

## 2017-08-13 DIAGNOSIS — Z7189 Other specified counseling: Secondary | ICD-10-CM

## 2017-08-13 DIAGNOSIS — E1165 Type 2 diabetes mellitus with hyperglycemia: Secondary | ICD-10-CM

## 2017-08-13 DIAGNOSIS — M810 Age-related osteoporosis without current pathological fracture: Secondary | ICD-10-CM

## 2017-08-13 DIAGNOSIS — R079 Chest pain, unspecified: Secondary | ICD-10-CM | POA: Diagnosis not present

## 2017-08-13 DIAGNOSIS — R12 Heartburn: Secondary | ICD-10-CM | POA: Diagnosis not present

## 2017-08-13 DIAGNOSIS — M170 Bilateral primary osteoarthritis of knee: Secondary | ICD-10-CM | POA: Diagnosis not present

## 2017-08-13 DIAGNOSIS — I708 Atherosclerosis of other arteries: Secondary | ICD-10-CM

## 2017-08-13 DIAGNOSIS — I70299 Other atherosclerosis of native arteries of extremities, unspecified extremity: Secondary | ICD-10-CM

## 2017-08-13 MED ORDER — CLOBETASOL PROPIONATE 0.05 % EX OINT: 1.0000 "application " | TOPICAL_OINTMENT | Freq: Two times a day (BID) | CUTANEOUS | 2 refills | Status: AC

## 2017-08-13 MED ORDER — DICLOFENAC SODIUM 1 % TD GEL: TRANSDERMAL | 5 refills | Status: AC

## 2017-08-13 NOTE — Assessment & Plan Note (Signed)
controlled 

## 2017-08-13 NOTE — Assessment & Plan Note (Signed)
Refills of cream provided

## 2017-08-13 NOTE — Assessment & Plan Note (Addendum)
Offered another DEXA scan; will stop the bisphosphonate with her reflux and possible gastritis

## 2017-08-13 NOTE — Assessment & Plan Note (Signed)
Discussion held with interpreter; patient wishes to be DNR; encouraged her to have discussion with her daughter

## 2017-08-13 NOTE — Progress Notes (Signed)
BP 138/76 (BP Location: Left Arm, Patient Position: Sitting, Cuff Size: Normal)   Pulse 90   Temp 98 F (36.7 C) (Oral)   Resp 14   Ht  (1.575 m)   Wt 138 lb 14.4 oz (63 kg)   SpO2 96%   BMI 25.41 kg/m    Subjective:    Patient ID: Tasha Rivera, female    DOB: Jun 10, 1924, 83 y.o.   MRN: [id number]  HPI: Tasha Rivera is a 83 y.o. female  Chief Complaint  Patient presents with  . Medication Refill    cream for arthritis and cream for rash  . Chest Pain    left side pt states its more discomfort than pain.   HPI Patient is here for follow-up, Korean-speaking interpreter here, daughter not here today She needs refills of her medicated cream for her arthriitis She also has psoriasis and needs refills of that cream She has chest pain on the left side; this has been chronic; she has been evaluated by a cardiologist; she thinks it is heartburn; it will come on after eating certain things and then she will drink milk and it will go away She has the large mass in her abdomen, left side; slow growing; she does not want surgery; likely pushing up She has type 2 diabetes and sees Dr. Verdis Frederickson at [facility] for this; last visit was last week and her A1c was 7.5, see Care Everywhere labs  Depression screen [facility] 2/9 08/13/2017 12/15/2016 10/31/2016 07/21/2016 04/25/2016  Decreased Interest 0 0 0 0 0  Down, Depressed, Hopeless 0 0 0 1 0  PHQ - 2 Score 0 0 0 1 0    Relevant past medical, surgical, family and social history reviewed Past Medical History:  Diagnosis Date  . Carotid atherosclerosis 2017  . Diabetes mellitus without complication (HCC)   . DJD (degenerative joint disease)   . History of ovarian cancer    she states approximately 60 years ago.  Marland Kitchen Hyperlipidemia   . Hypertension    Past Surgical History:  Procedure Laterality Date  . ABDOMINAL HYSTERECTOMY     partial, due to ovarian cancer  . APPENDECTOMY    . CATARACT EXTRACTION     Family History  Problem Relation  Age of Onset  . Cancer Son 34       stomach   Social History   Social History  . Marital status: Widowed    Spouse name: N/A  . Number of children: N/A  . Years of education: N/A   Occupational History  . Not on file.   Social History Main Topics  . Smoking status: Never Smoker  . Smokeless tobacco: Never Used  . Alcohol use No  . Drug use: No  . Sexual activity: Not Currently   Other Topics Concern  . Not on file   Social History Narrative  . No narrative on file    Interim medical history since last visit reviewed. Allergies and medications reviewed  Review of Systems Per HPI unless specifically indicated above     Objective:    BP 138/76 (BP Location: Left Arm, Patient Position: Sitting, Cuff Size: Normal)   Pulse 90   Temp 98 F (36.7 C) (Oral)   Resp 14   Ht  (1.575 m)   Wt 138 lb 14.4 oz (63 kg)   SpO2 96%   BMI 25.41 kg/m   Wt Readings from Last 3 Encounters:  08/13/17 138 lb 14.4 oz (63 kg)  06/06/17 140 lb 8 oz (63.7 kg)  12/15/16 139 lb 6.4 oz (63.2 kg)    Physical Exam  Constitutional: She appears well-developed and well-nourished.  Elderly female; weight stable  HENT:  Head: Normocephalic and atraumatic.  Mouth/Throat: Mucous membranes are normal.  Eyes: EOM are normal. No scleral icterus.  Cardiovascular: Normal rate and regular rhythm.   Pulmonary/Chest: Effort normal and breath sounds normal.  Abdominal: She exhibits no distension. There is no rigidity and no guarding.  Fullness and mild tenderness along left lower quadrant, left colic gutter; unchanged from last visit  Musculoskeletal: She exhibits no edema.  Neurological: She is alert. She displays no tremor.  Seated in wheelchair; gait not assessed  Skin: Skin is warm. Rash (both legs, mildly erythematous) noted. She is not diaphoretic. No pallor.  No jaundice  Psychiatric: She has a normal mood and affect. Her behavior is normal. Her mood appears not anxious. Cognition and  memory are not impaired. She does not exhibit a depressed mood.  Very pleasant   Diabetic Foot Form - Detailed   Diabetic Foot Exam - detailed Diabetic Foot exam was performed with the following findings:  Yes 08/13/2017  4:18 PM  Visual Foot Exam completed.:  Yes  Pulse Foot Exam completed.:  Yes  Right Dorsalis Pedis:  Present Left Dorsalis Pedis:  Present  Sensory Foot Exam Completed.:  Yes Semmes-Weinstein Monofilament Test R Site 1-Great Toe:  Pos L Site 1-Great Toe:  Pos        Results for orders placed or performed in visit on 07/24/17  HM DIABETES EYE EXAM  Result Value Ref Range   HM Diabetic Eye Exam No Retinopathy No Retinopathy      Assessment & Plan:   Problem List Items Addressed This Visit      Cardiovascular and Mediastinum   Essential hypertension, benign    controlled      Aorto-iliac atherosclerosis (HCC)    Elderly female with 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, so not being aggressive other than trying to control DM and BP and cholesterol        Endocrine   Type 2 diabetes mellitus with hyperglycemia (HCC)    Managed by endocrinologist, last A1c showed fair control, 7.5 very acceptable for her age and comorbidities        Musculoskeletal and Integument   Psoriasis, guttate    Refills of cream provided      Primary osteoarthritis of both knees    Refills provided for her topical agent      Osteoporosis    Offered another DEXA scan; will stop the bisphosphonate with her reflux and possible gastritis        Other   DNR (do not resuscitate) discussion    Discussion held with interpreter; patient wishes to be DNR; encouraged her to have discussion with her daughter      Relevant Orders   DNR (Do Not Resuscitate)   Pelvic mass in female - Primary    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 Sherie Don.  An after-visit summary was printed and given to the patient at check-out.  Please see the patient instructions which may contain other information and recommendations beyond what is mentioned above in the assessment and plan.  Meds ordered this encounter  Medications  . PAZEO 0.7 % SOLN    Sig: Place 1 drop into both eyes at bedtime.  . chlorhexidine (PERIDEX) 0.12 % solution    Sig: RINSE 15 ML PO FOR 30 SECONDS THEN SPIT OUT UTD. DO NOT RINSE WITH WATER AND DO NOT SWALLOW    Refill:  0  . DISCONTD: clobetasol ointment (TEMOVATE) 0.05 %    Sig: Apply 1 application topically 2 (two) times daily.  . clobetasol ointment (TEMOVATE) 0.05 %    Sig: Apply 1 application topically 2 (two) times daily.    Dispense:  30 g    Refill:  2  . diclofenac sodium (VOLTAREN) 1 % GEL    Sig: Apply 2-4 grams to each knee up to four times a day    Dispense:  100 g    Refill:  5    Orders Placed This Encounter  Procedures  . DNR (Do Not Resuscitate)   Face-to-face time with patient was more than 40 minutes, >50% time spent counseling and coordination of care DNR discussion and working with interpreter required the extra time

## 2017-08-13 NOTE — Patient Instructions (Addendum)
STOP the alendronate (Fosamax) for the rest of the year (2018) to see if that helps with heartburn

## 2017-08-13 NOTE — Assessment & Plan Note (Signed)
Managed by endocrinologist, last A1c showed fair control, 7.5 very acceptable for her age and comorbidities

## 2017-08-13 NOTE — Assessment & Plan Note (Signed)
Refill the osteoarthritis cream

## 2017-08-15 DIAGNOSIS — R12 Heartburn: Secondary | ICD-10-CM | POA: Insufficient documentation

## 2017-08-15 NOTE — Assessment & Plan Note (Signed)
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

## 2017-08-15 NOTE — Assessment & Plan Note (Signed)
Refills provided for her topical agent

## 2017-08-15 NOTE — Assessment & Plan Note (Signed)
Stop the bisphosphonate; continue PPI

## 2017-08-15 NOTE — Assessment & Plan Note (Signed)
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

## 2017-12-12 ENCOUNTER — Inpatient Hospital Stay: Payer: Medicare Other | Attending: Obstetrics and Gynecology

## 2017-12-13 ENCOUNTER — Telehealth: Payer: Self-pay

## 2017-12-13 NOTE — Telephone Encounter (Signed)
Ms. Willaim Baneark did not come to her 6 month follow up appointment with Dr. Johnnette LitterBerchuck. Sent message to scheduling to contact and see if she would like to reschedule. Oncology Nurse Navigator Documentation  Navigator Location: [facility] (12/13/17 1500)   )Navigator Encounter Type: Follow-up Appt (12/13/17 1500)                                                    Time Spent with Patient: 15 (12/13/17 1500)

## 2017-12-18 ENCOUNTER — Telehealth: Payer: Self-pay | Admitting: Family Medicine

## 2017-12-18 DIAGNOSIS — M5136 Other intervertebral disc degeneration, lumbar region: Secondary | ICD-10-CM

## 2017-12-18 DIAGNOSIS — M170 Bilateral primary osteoarthritis of knee: Secondary | ICD-10-CM

## 2017-12-18 DIAGNOSIS — R531 Weakness: Secondary | ICD-10-CM

## 2017-12-18 DIAGNOSIS — R269 Unspecified abnormalities of gait and mobility: Secondary | ICD-10-CM

## 2017-12-18 NOTE — Telephone Encounter (Signed)
Copied from CRM [id number]. Topic: Inquiry >> Dec 18, 2017  5:50 PM Raquel SarnaHayes, Teresa G wrote: Maralyn SagoSarah - Granddaughter is calling for pt needing in home care ASAP. She said she isn't officially on the DPR - she is out of [place] and calling on behalf of pt because no one speaks [place].  Just calling to try and get the n home care for grandmother - pt.  Maralyn SagoSarah- [phone]

## 2017-12-19 ENCOUNTER — Ambulatory Visit: Payer: Self-pay | Admitting: Family Medicine

## 2017-12-19 DIAGNOSIS — R531 Weakness: Secondary | ICD-10-CM | POA: Insufficient documentation

## 2017-12-19 NOTE — Telephone Encounter (Signed)
So I just want to verify... You spoke with [place] interpreter and talked with patient and there is no acute / urgent need?

## 2017-12-19 NOTE — Assessment & Plan Note (Signed)
[facility]

## 2017-12-19 NOTE — Telephone Encounter (Signed)
Pt was supposed to see you today and has been rescheduled to the 20th will discuss then

## 2017-12-19 NOTE — Assessment & Plan Note (Signed)
Ordering HH evaluation, nursing, PT, OT evaluation and therapy as needing, including home aide if covered

## 2017-12-19 NOTE — Addendum Note (Signed)
Addended by: LADA, Janit BernMELINDA P on: 12/19/2017 12:30 PM   Modules accepted: Orders

## 2017-12-19 NOTE — Telephone Encounter (Signed)
Yes so I talked with interpreter and daughter she will come in for appt on the 20th and discuss then but they would like to go ahead and order mother is very weak, cannot stand, or feed herself, they need help changing her and asisting her daily needs because daughter is trying to take care of her and work.

## 2017-12-19 NOTE — Telephone Encounter (Signed)
Thank you so much

## 2017-12-19 NOTE — Assessment & Plan Note (Signed)
[facility]

## 2017-12-19 NOTE — Telephone Encounter (Signed)
1. Please get more information using Korean-speaking interpreter. We need to find out if she needs to go to the ER or urgent care, what is the urgent need. Please triage her appropriately  2. If she does not need to go to ER or urgent care, she will need an appointment ASAP. Per Medicare rules, she will need a face-to-face visit within the next 30 days or Medicare will not pay for anything I order. If nothing worrisome, I can see her next week during a lunch (noon visit is okay with me) Once you identify the needs, then please order appropriately (home health nurse, PT, home aide, etc.) with diagnoses or symptoms (weakness, fall, arthritis, etc.)  Thank you

## 2017-12-19 NOTE — Assessment & Plan Note (Signed)
[facility]

## 2017-12-21 ENCOUNTER — Emergency Department: Payer: Medicare Other

## 2017-12-21 ENCOUNTER — Inpatient Hospital Stay
Admission: EM | Admit: 2017-12-21 | Discharge: 2018-01-04 | DRG: 871 | Disposition: E | Payer: Medicare Other | Attending: Internal Medicine | Admitting: Internal Medicine

## 2017-12-21 ENCOUNTER — Other Ambulatory Visit: Payer: Self-pay

## 2017-12-21 ENCOUNTER — Inpatient Hospital Stay: Payer: Medicare Other

## 2017-12-21 ENCOUNTER — Telehealth: Payer: Self-pay | Admitting: Family Medicine

## 2017-12-21 DIAGNOSIS — Z800 Family history of malignant neoplasm of digestive organs: Secondary | ICD-10-CM

## 2017-12-21 DIAGNOSIS — E876 Hypokalemia: Secondary | ICD-10-CM | POA: Diagnosis present

## 2017-12-21 DIAGNOSIS — J9601 Acute respiratory failure with hypoxia: Secondary | ICD-10-CM | POA: Diagnosis not present

## 2017-12-21 DIAGNOSIS — E785 Hyperlipidemia, unspecified: Secondary | ICD-10-CM | POA: Diagnosis present

## 2017-12-21 DIAGNOSIS — Z0189 Encounter for other specified special examinations: Secondary | ICD-10-CM

## 2017-12-21 DIAGNOSIS — I6529 Occlusion and stenosis of unspecified carotid artery: Secondary | ICD-10-CM | POA: Diagnosis present

## 2017-12-21 DIAGNOSIS — R1909 Other intra-abdominal and pelvic swelling, mass and lump: Secondary | ICD-10-CM | POA: Diagnosis present

## 2017-12-21 DIAGNOSIS — E872 Acidosis, unspecified: Secondary | ICD-10-CM

## 2017-12-21 DIAGNOSIS — A419 Sepsis, unspecified organism: Secondary | ICD-10-CM | POA: Diagnosis present

## 2017-12-21 DIAGNOSIS — Z9849 Cataract extraction status, unspecified eye: Secondary | ICD-10-CM | POA: Diagnosis not present

## 2017-12-21 DIAGNOSIS — Z66 Do not resuscitate: Secondary | ICD-10-CM | POA: Diagnosis present

## 2017-12-21 DIAGNOSIS — Z79899 Other long term (current) drug therapy: Secondary | ICD-10-CM

## 2017-12-21 DIAGNOSIS — Z452 Encounter for adjustment and management of vascular access device: Secondary | ICD-10-CM

## 2017-12-21 DIAGNOSIS — J189 Pneumonia, unspecified organism: Secondary | ICD-10-CM

## 2017-12-21 DIAGNOSIS — J15211 Pneumonia due to Methicillin susceptible Staphylococcus aureus: Secondary | ICD-10-CM | POA: Diagnosis not present

## 2017-12-21 DIAGNOSIS — M1990 Unspecified osteoarthritis, unspecified site: Secondary | ICD-10-CM | POA: Diagnosis present

## 2017-12-21 DIAGNOSIS — J9600 Acute respiratory failure, unspecified whether with hypoxia or hypercapnia: Secondary | ICD-10-CM

## 2017-12-21 DIAGNOSIS — R6521 Severe sepsis with septic shock: Secondary | ICD-10-CM

## 2017-12-21 DIAGNOSIS — Z7982 Long term (current) use of aspirin: Secondary | ICD-10-CM | POA: Diagnosis not present

## 2017-12-21 DIAGNOSIS — E861 Hypovolemia: Secondary | ICD-10-CM | POA: Diagnosis present

## 2017-12-21 DIAGNOSIS — I10 Essential (primary) hypertension: Secondary | ICD-10-CM | POA: Diagnosis present

## 2017-12-21 DIAGNOSIS — J1008 Influenza due to other identified influenza virus with other specified pneumonia: Secondary | ICD-10-CM | POA: Diagnosis present

## 2017-12-21 DIAGNOSIS — N179 Acute kidney failure, unspecified: Secondary | ICD-10-CM | POA: Diagnosis not present

## 2017-12-21 DIAGNOSIS — Z90711 Acquired absence of uterus with remaining cervical stump: Secondary | ICD-10-CM | POA: Diagnosis not present

## 2017-12-21 DIAGNOSIS — G9341 Metabolic encephalopathy: Secondary | ICD-10-CM | POA: Diagnosis present

## 2017-12-21 DIAGNOSIS — J1100 Influenza due to unidentified influenza virus with unspecified type of pneumonia: Secondary | ICD-10-CM | POA: Diagnosis not present

## 2017-12-21 DIAGNOSIS — Z515 Encounter for palliative care: Secondary | ICD-10-CM | POA: Diagnosis not present

## 2017-12-21 DIAGNOSIS — R4182 Altered mental status, unspecified: Secondary | ICD-10-CM

## 2017-12-21 DIAGNOSIS — I4891 Unspecified atrial fibrillation: Secondary | ICD-10-CM | POA: Diagnosis not present

## 2017-12-21 DIAGNOSIS — Z7984 Long term (current) use of oral hypoglycemic drugs: Secondary | ICD-10-CM

## 2017-12-21 DIAGNOSIS — E1165 Type 2 diabetes mellitus with hyperglycemia: Secondary | ICD-10-CM | POA: Diagnosis present

## 2017-12-21 DIAGNOSIS — Z8543 Personal history of malignant neoplasm of ovary: Secondary | ICD-10-CM

## 2017-12-21 LAB — CBC WITH DIFFERENTIAL/PLATELET
Basophils Absolute: 0 10*3/uL (ref 0–0.1)
Basophils Relative: 0 %
EOS PCT: 1 %
Eosinophils Absolute: 0.1 10*3/uL (ref 0–0.7)
HCT: 32.7 % — ABNORMAL LOW (ref 35.0–47.0)
Hemoglobin: 10.6 g/dL — ABNORMAL LOW (ref 12.0–16.0)
LYMPHS ABS: 0.4 10*3/uL — AB (ref 1.0–3.6)
LYMPHS PCT: 3 %
MCH: 29 pg (ref 26.0–34.0)
MCHC: 32.2 g/dL (ref 32.0–36.0)
MCV: 90 fL (ref 80.0–100.0)
MONOS PCT: 0 %
Monocytes Absolute: 0.1 10*3/uL — ABNORMAL LOW (ref 0.2–0.9)
Neutro Abs: 13.4 10*3/uL — ABNORMAL HIGH (ref 1.4–6.5)
Neutrophils Relative %: 96 %
Platelets: 275 10*3/uL (ref 150–440)
RBC: 3.64 MIL/uL — AB (ref 3.80–5.20)
RDW: 13.1 % (ref 11.5–14.5)
WBC: 14 10*3/uL — AB (ref 3.6–11.0)

## 2017-12-21 LAB — BLOOD GAS, ARTERIAL
ACID-BASE DEFICIT: 18.5 mmol/L — AB (ref 0.0–2.0)
ALLENS TEST (PASS/FAIL): POSITIVE — AB
Acid-base deficit: 19.6 mmol/L — ABNORMAL HIGH (ref 0.0–2.0)
BICARBONATE: 10.8 mmol/L — AB (ref 20.0–28.0)
Bicarbonate: 11.3 mmol/L — ABNORMAL LOW (ref 20.0–28.0)
FIO2: 60
FIO2: 0.5
MECHVT: 450 mL
Mechanical Rate: 15
Mechanical Rate: 15
O2 SAT: 67.4 %
O2 SAT: 73.3 %
PATIENT TEMPERATURE: 37
PATIENT TEMPERATURE: 37
PCO2 ART: 41 mmHg (ref 32.0–48.0)
PCO2 ART: 42 mmHg (ref 32.0–48.0)
PEEP/CPAP: 8 cmH2O
PEEP: 5 cmH2O
PH ART: 7.05 — AB (ref 7.350–7.450)
PO2 ART: 59 mmHg — AB (ref 83.0–108.0)
RATE: 15 resp/min
VT: 450 mL
pH, Arterial: 7.02 — CL (ref 7.350–7.450)
pO2, Arterial: 52 mmHg — ABNORMAL LOW (ref 83.0–108.0)

## 2017-12-21 LAB — BASIC METABOLIC PANEL
Anion gap: 9 (ref 5–15)
BUN: 35 mg/dL — AB (ref 6–20)
CO2: 13 mmol/L — ABNORMAL LOW (ref 22–32)
CREATININE: 1.88 mg/dL — AB (ref 0.44–1.00)
Calcium: 7 mg/dL — ABNORMAL LOW (ref 8.9–10.3)
Chloride: 121 mmol/L — ABNORMAL HIGH (ref 101–111)
GFR calc Af Amer: 26 mL/min — ABNORMAL LOW (ref >60)
GFR, EST NON AFRICAN AMERICAN: 22 mL/min — AB (ref >60)
GLUCOSE: 369 mg/dL — AB (ref 65–99)
Potassium: 2.7 mmol/L — CL (ref 3.5–5.1)
SODIUM: 143 mmol/L (ref 135–145)

## 2017-12-21 LAB — GLUCOSE, CAPILLARY
GLUCOSE-CAPILLARY: 395 mg/dL — AB (ref 65–99)
GLUCOSE-CAPILLARY: 343 mg/dL — AB (ref 65–99)
GLUCOSE-CAPILLARY: 304 mg/dL — AB (ref 65–99)
Glucose-Capillary: 297 mg/dL — ABNORMAL HIGH (ref 65–99)
Glucose-Capillary: 308 mg/dL — ABNORMAL HIGH (ref 65–99)
Glucose-Capillary: 342 mg/dL — ABNORMAL HIGH (ref 65–99)
Glucose-Capillary: 410 mg/dL — ABNORMAL HIGH (ref 65–99)

## 2017-12-21 LAB — COMPREHENSIVE METABOLIC PANEL
ALBUMIN: 2 g/dL — AB (ref 3.5–5.0)
ALK PHOS: 76 U/L (ref 38–126)
ALT: 13 U/L — AB (ref 14–54)
AST: 26 U/L (ref 15–41)
Anion gap: 13 (ref 5–15)
BILIRUBIN TOTAL: 1.2 mg/dL (ref 0.3–1.2)
BUN: 33 mg/dL — AB (ref 6–20)
CALCIUM: 8 mg/dL — AB (ref 8.9–10.3)
CO2: 13 mmol/L — AB (ref 22–32)
CREATININE: 1.61 mg/dL — AB (ref 0.44–1.00)
Chloride: 113 mmol/L — ABNORMAL HIGH (ref 101–111)
GFR calc Af Amer: 31 mL/min — ABNORMAL LOW (ref >60)
GFR calc non Af Amer: 27 mL/min — ABNORMAL LOW (ref >60)
GLUCOSE: 415 mg/dL — AB (ref 65–99)
Potassium: 3.1 mmol/L — ABNORMAL LOW (ref 3.5–5.1)
SODIUM: 139 mmol/L (ref 135–145)
TOTAL PROTEIN: 6 g/dL — AB (ref 6.5–8.1)

## 2017-12-21 LAB — URINALYSIS, COMPLETE (UACMP) WITH MICROSCOPIC
Glucose, UA: >1000 mg/dL — AB
Ketones, ur: 40 mg/dL — AB
LEUKOCYTES UA: NEGATIVE
NITRITE: NEGATIVE
PROTEIN: 100 mg/dL — AB
Specific Gravity, Urine: 1.02 (ref 1.005–1.030)
WBC UA: NONE SEEN WBC/hpf (ref 0–5)
pH: 5.5 (ref 5.0–8.0)

## 2017-12-21 LAB — PROTIME-INR
INR: 1.36
Prothrombin Time: 16.7 seconds — ABNORMAL HIGH (ref 11.4–15.2)

## 2017-12-21 LAB — LACTIC ACID, PLASMA
Lactic Acid, Venous: 2.9 mmol/L (ref 0.5–1.9)
Lactic Acid, Venous: 2.7 mmol/L (ref 0.5–1.9)
Lactic Acid, Venous: 4.7 mmol/L (ref 0.5–1.9)

## 2017-12-21 LAB — INFLUENZA PANEL BY PCR (TYPE A & B)
INFLAPCR: POSITIVE — AB
Influenza B By PCR: NEGATIVE

## 2017-12-21 LAB — POTASSIUM: Potassium: 2.6 mmol/L — CL (ref 3.5–5.1)

## 2017-12-21 LAB — MRSA PCR SCREENING: MRSA BY PCR: NEGATIVE

## 2017-12-21 MED ORDER — SODIUM CHLORIDE 0.9 % IV SOLN
INTRAVENOUS | Status: DC
  Administered 2017-12-21: 2.5 [IU]/h via INTRAVENOUS
  Administered 2017-12-22: 2 [IU]/h via INTRAVENOUS
  Filled 2017-12-21 (×2): qty 1

## 2017-12-21 MED ORDER — INSULIN ASPART 100 UNIT/ML ~~LOC~~ SOLN
SUBCUTANEOUS | Status: AC
  Administered 2017-12-21: 7 [IU] via SUBCUTANEOUS
  Filled 2017-12-21: qty 1

## 2017-12-21 MED ORDER — SODIUM BICARBONATE 8.4 % IV SOLN
INTRAVENOUS | Status: DC
  Administered 2017-12-22 (×4): via INTRAVENOUS
  Filled 2017-12-21 (×9): qty 850

## 2017-12-21 MED ORDER — ONDANSETRON HCL 4 MG PO TABS: 4.0000 mg | ORAL_TABLET | Freq: Four times a day (QID) | ORAL | Status: DC | PRN

## 2017-12-21 MED ORDER — VANCOMYCIN HCL IN DEXTROSE 1-5 GM/200ML-% IV SOLN
1000.0000 mg | Freq: Once | INTRAVENOUS | Status: AC
  Administered 2017-12-21: 1000 mg via INTRAVENOUS
  Filled 2017-12-21: qty 200

## 2017-12-21 MED ORDER — SODIUM CHLORIDE 0.9 % IV BOLUS (SEPSIS)
1000.0000 mL | Freq: Once | INTRAVENOUS | Status: AC
  Administered 2017-12-21: 1000 mL via INTRAVENOUS

## 2017-12-21 MED ORDER — SODIUM CHLORIDE 0.9 % IV BOLUS (SEPSIS)
1000.0000 mL | INTRAVENOUS | Status: AC
  Administered 2017-12-21: 1000 mL via INTRAVENOUS

## 2017-12-21 MED ORDER — FENTANYL CITRATE (PF) 100 MCG/2ML IJ SOLN
INTRAMUSCULAR | Status: AC
  Administered 2017-12-21: 50 ug via INTRAVENOUS
  Filled 2017-12-21: qty 2

## 2017-12-21 MED ORDER — SODIUM CHLORIDE 0.9 % IV SOLN
2.0000 g | Freq: Once | INTRAVENOUS | Status: AC
  Administered 2017-12-21: 2 g via INTRAVENOUS
  Filled 2017-12-21: qty 2

## 2017-12-21 MED ORDER — SODIUM BICARBONATE 8.4 % IV SOLN: INTRAVENOUS | Status: DC

## 2017-12-21 MED ORDER — ASPIRIN 81 MG PO CHEW
324.0000 mg | CHEWABLE_TABLET | ORAL | Status: AC
  Administered 2017-12-21: 324 mg via ORAL
  Filled 2017-12-21: qty 4

## 2017-12-21 MED ORDER — FENTANYL CITRATE (PF) 100 MCG/2ML IJ SOLN
12.5000 ug | INTRAMUSCULAR | Status: DC | PRN
  Administered 2017-12-21 – 2017-12-22 (×4): 12.5 ug via INTRAVENOUS
  Filled 2017-12-21 (×4): qty 2

## 2017-12-21 MED ORDER — FENTANYL CITRATE (PF) 100 MCG/2ML IJ SOLN
50.0000 ug | INTRAMUSCULAR | Status: AC
  Administered 2017-12-21: 50 ug via INTRAVENOUS

## 2017-12-21 MED ORDER — SODIUM BICARBONATE 8.4 % IV SOLN
150.0000 meq | Freq: Once | INTRAVENOUS | Status: AC
  Administered 2017-12-22: 150 meq via INTRAVENOUS
  Filled 2017-12-21: qty 150

## 2017-12-21 MED ORDER — SODIUM CHLORIDE 0.9 % IV BOLUS (SEPSIS)
500.0000 mL | Freq: Once | INTRAVENOUS | Status: AC
  Administered 2017-12-21: 500 mL via INTRAVENOUS

## 2017-12-21 MED ORDER — CHLORHEXIDINE GLUCONATE 0.12% ORAL RINSE (MEDLINE KIT)
15.0000 mL | Freq: Two times a day (BID) | OROMUCOSAL | Status: DC
  Administered 2017-12-21 – 2017-12-22 (×3): 15 mL via OROMUCOSAL

## 2017-12-21 MED ORDER — INSULIN ASPART 100 UNIT/ML ~~LOC~~ SOLN: 1.0000 [IU] | SUBCUTANEOUS | Status: DC

## 2017-12-21 MED ORDER — ACETAMINOPHEN 160 MG/5ML PO SOLN
650.0000 mg | Freq: Four times a day (QID) | ORAL | Status: DC | PRN
  Administered 2017-12-21 – 2017-12-22 (×2): 650 mg via ORAL
  Filled 2017-12-21 (×3): qty 20.3

## 2017-12-21 MED ORDER — ROCURONIUM BROMIDE 50 MG/5ML IV SOLN
INTRAVENOUS | Status: AC | PRN
  Administered 2017-12-21: 60 mg via INTRAVENOUS

## 2017-12-21 MED ORDER — SODIUM CHLORIDE 0.9 % IV SOLN
1.0000 g | INTRAVENOUS | Status: DC
  Administered 2017-12-22 – 2017-12-23 (×2): 1 g via INTRAVENOUS
  Filled 2017-12-21 (×2): qty 1

## 2017-12-21 MED ORDER — NOREPINEPHRINE BITARTRATE 1 MG/ML IV SOLN
0.0000 ug/min | INTRAVENOUS | Status: DC
  Administered 2017-12-21: 30 ug/min via INTRAVENOUS
  Administered 2017-12-21: 5 ug/min via INTRAVENOUS
  Administered 2017-12-21: 30 ug/min via INTRAVENOUS
  Administered 2017-12-22: 34 ug/min via INTRAVENOUS
  Administered 2017-12-22: 30 ug/min via INTRAVENOUS
  Administered 2017-12-22: 25 ug/min via INTRAVENOUS
  Administered 2017-12-22: 30 ug/min via INTRAVENOUS
  Administered 2017-12-22: 25 ug/min via INTRAVENOUS
  Administered 2017-12-22: 30 ug/min via INTRAVENOUS
  Filled 2017-12-21 (×12): qty 4

## 2017-12-21 MED ORDER — ENOXAPARIN SODIUM 40 MG/0.4ML ~~LOC~~ SOLN
30.0000 mg | SUBCUTANEOUS | Status: DC
  Administered 2017-12-21: 30 mg via SUBCUTANEOUS
  Filled 2017-12-21 (×3): qty 0.4

## 2017-12-21 MED ORDER — VASOPRESSIN 20 UNIT/ML IV SOLN
0.0400 [IU]/min | INTRAVENOUS | Status: DC
  Filled 2017-12-21 (×2): qty 2

## 2017-12-21 MED ORDER — INSULIN ASPART 100 UNIT/ML ~~LOC~~ SOLN
0.0000 [IU] | Freq: Three times a day (TID) | SUBCUTANEOUS | Status: DC
  Administered 2017-12-21: 7 [IU] via SUBCUTANEOUS

## 2017-12-21 MED ORDER — SODIUM CHLORIDE 0.9 % IV SOLN: 250.0000 mL | INTRAVENOUS | Status: DC | PRN

## 2017-12-21 MED ORDER — OSELTAMIVIR PHOSPHATE 6 MG/ML PO SUSR
45.0000 mg | Freq: Every day | ORAL | Status: DC
  Administered 2017-12-21 – 2017-12-22 (×2): 45 mg via ORAL
  Filled 2017-12-21 (×3): qty 12.5

## 2017-12-21 MED ORDER — MIDAZOLAM HCL 5 MG/5ML IJ SOLN
INTRAMUSCULAR | Status: AC | PRN
  Administered 2017-12-21: 1 mg via INTRAVENOUS

## 2017-12-21 MED ORDER — PANTOPRAZOLE SODIUM 40 MG IV SOLR
40.0000 mg | INTRAVENOUS | Status: DC
  Administered 2017-12-21 – 2017-12-22 (×2): 40 mg via INTRAVENOUS
  Filled 2017-12-21 (×2): qty 40

## 2017-12-21 MED ORDER — SODIUM BICARBONATE 8.4 % IV SOLN
100.0000 meq | Freq: Once | INTRAVENOUS | Status: AC
  Administered 2017-12-21: 100 meq via INTRAVENOUS
  Filled 2017-12-21: qty 50

## 2017-12-21 MED ORDER — IPRATROPIUM-ALBUTEROL 0.5-2.5 (3) MG/3ML IN SOLN
3.0000 mL | Freq: Four times a day (QID) | RESPIRATORY_TRACT | Status: DC
  Administered 2017-12-21 – 2017-12-22 (×5): 3 mL via RESPIRATORY_TRACT
  Filled 2017-12-21 (×5): qty 3

## 2017-12-21 MED ORDER — POTASSIUM CHLORIDE IN NACL 20-0.9 MEQ/L-% IV SOLN
INTRAVENOUS | Status: DC
  Administered 2017-12-21 (×2): via INTRAVENOUS
  Filled 2017-12-21 (×4): qty 1000

## 2017-12-21 MED ORDER — ONDANSETRON HCL 4 MG/2ML IJ SOLN: 4.0000 mg | Freq: Four times a day (QID) | INTRAMUSCULAR | Status: DC | PRN

## 2017-12-21 MED ORDER — ASPIRIN 300 MG RE SUPP: 300.0000 mg | RECTAL | Status: AC

## 2017-12-21 MED ORDER — VANCOMYCIN HCL IN DEXTROSE 750-5 MG/150ML-% IV SOLN
750.0000 mg | INTRAVENOUS | Status: DC
  Filled 2017-12-21: qty 150

## 2017-12-21 MED ORDER — ORAL CARE MOUTH RINSE
15.0000 mL | Freq: Four times a day (QID) | OROMUCOSAL | Status: DC
  Administered 2017-12-21 – 2017-12-23 (×7): 15 mL via OROMUCOSAL

## 2017-12-21 MED ORDER — POTASSIUM CHLORIDE 10 MEQ/50ML IV SOLN
10.0000 meq | INTRAVENOUS | Status: AC
  Administered 2017-12-21 – 2017-12-22 (×6): 10 meq via INTRAVENOUS
  Filled 2017-12-21 (×6): qty 50

## 2017-12-21 NOTE — Procedures (Signed)
Central Venous Catheter Placement:TRIPLE LUMEN Indication: Patient receiving vesicant or irritant drug.  Consent:obtained from family, signed and in chart    Hand washing performed prior to starting the procedure.   Procedure:   An active timeout was performed and correct patient, name, & ID confirmed.   Patient was positioned correctly for central venous access.  Patient was prepped using strict sterile technique including chlorohexadine preps, sterile drape, sterile gown and sterile gloves.    The area was prepped, draped and anesthetized in the usual sterile manner. Patient comfort was obtained.    A triple lumen catheter was placed in right Internal Jugular Vein There was good blood return, catheter caps were placed on lumens, catheter flushed easily, the line was secured and a sterile dressing and BIO-PATCH applied.   Ultrasound was used to visualize vasculature and guidance of needle.   Number of Attempts: 1 Complications:none Estimated Blood Loss: none Chest Radiograph indicated and ordered.  Operator: Brayton ElEdenburn, Richards supervising   Tasha Rivera Halia Franey, PA-S

## 2017-12-21 NOTE — ED Notes (Signed)
Dr. Schaevitz at bedside preparing to intubate 

## 2017-12-21 NOTE — Telephone Encounter (Signed)
There is no Dr. Elinor Dodgeoug Edenburn in our system, but there is a PA student by that name I called the ICU and spoke with staff, they checked with Dr. Parthenia Amesarol Richards and he is the student working with her We talked, he saw our code discussion from October; I explained that when patients hit the door in the ER, my outpatient code status is canceled He and the attending are going to meet with daughter and [place] interpreter to discuss plan of care I'm certainly available if there is anything I can say or do to help ease her mind about her mother's wishes I gave PA student Doug my cell number and they can call any time I thanked him for caring for our mutual patient

## 2017-12-21 NOTE — Progress Notes (Signed)
CODE SEPSIS - PHARMACY COMMUNICATION  **Broad Spectrum Antibiotics should be administered within 1 hour of Sepsis diagnosis**  Time Code Sepsis Called/Page Received: 1051  Antibiotics Ordered: cefepime/vancomycin  Time of 1st antibiotic administration: 1127  Additional action taken by pharmacy: N/A  If necessary, Name of Provider/Nurse Contacted: N/A    Simpson,Michael L ,PharmD Clinical Pharmacist  01/16/18  12:29 PM

## 2017-12-21 NOTE — ED Notes (Signed)
Pt tolerating ET tube well at this time.  Dr. Amado CoeGouru using interpretation iPad to inform family.  Family is Bermud[place]orean speaking only, per Dr. Pershing ProudSchaevitz.

## 2017-12-21 NOTE — Telephone Encounter (Signed)
-----   Message from Ludwig LeanValerie Anne Chandler, RN sent at 03-Oct-2018 10:08 AM EST ----- Regarding: Cancellation of Order # [id number] Order number [id number] for the procedure DO NOT ATTEMPT  RESUSCITATION (DNR) [COD1] has been canceled by Ludwig LeanValerie Anne  Chandler, RN [phone]]. This procedure was ordered by  Kerman PasseyMelinda P Yittel Emrich, MD [[id number]][[id number]] on Aug 13, 2017 for the patient Tasha Rivera [[id number]][[id number]]. The reason for cancellation was "None".

## 2017-12-21 NOTE — ED Notes (Signed)
Report given to Iris RN 

## 2017-12-21 NOTE — ED Notes (Signed)
Pt arrived via EMS from home with reports of unresponsiveness, on arrival to ED, pt not responding to sternal rub and warm to the touch.   Pt intubated, labs drawn, flu swab obtained and fluids started.   Foley and OG tube inserted.   Pt resting at this time.

## 2017-12-21 NOTE — Progress Notes (Signed)
Chaplain responded to an ED code. Chaplain saw family in the waiting area and went to investigate. Ch spoke with Dr. And escorted the family to consultation room. Ch. Retrieve translation machine and got the Dr. to inform family of the condition of the Pt. Ch. Received another page and left . Will follow up.   Sep 02, 2018 1100  Clinical Encounter Type  Visited With Family  Visit Type Initial  Referral From Chaplain  Spiritual Encounters  Spiritual Needs Emotional

## 2017-12-21 NOTE — Telephone Encounter (Signed)
Call center called and had Dr Elinor Dodgeoug Edenburn on the phone wanting your cell number. He has questions about this patient and seen where you had given your cell number to the ER DOCTOR. Please give him a call at [phone]. Thanks

## 2017-12-21 NOTE — ED Provider Notes (Addendum)
[facility] Emergency Department Provider Note  ____________________________________________   First MD Initiated Contact with Patient 12/18/2017 1045     (approximate)  I have reviewed the triage vital signs and the nursing notes.   HISTORY  Chief Complaint Code Sepsis and Altered Mental Status Patient's family is [place].  The iPad interpretation service was utilized.  HPI Tasha Rivera is a 84 y.o. female with a history of diabetes as well as hypertension who is presenting to the emergency department unresponsive.  Per EMS, they say that her lungs sound "full."  The patient is nonverbal at this time.  However, I was able to discuss the progression of the disease with her family.  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  She is also been having flulike symptoms over the past 2 weeks and has not seen a doctor taking any medications for these most recent symptoms.  Therefore the most recent symptoms as being cough and "flulike symptoms."  However, they state that her altered mental status happened quite suddenly about 3 hours ago.   Past Medical History:  Diagnosis Date  . Carotid atherosclerosis 2017  . Diabetes mellitus without complication (HCC)   . DJD (degenerative joint disease)   . History of ovarian cancer    she states approximately 60 years ago.  Marland Kitchen. Hyperlipidemia   . Hypertension     Patient Active Problem List   Diagnosis Date Noted  . Acute respiratory failure with hypoxia (HCC) 12/31/2017  . Weakness 12/19/2017  . Heartburn 08/15/2017  . DNR (do not resuscitate) discussion 08/13/2017  . Carotid stenosis 11/17/2016  . Abdominal pain 10/31/2016  . Carpal tunnel syndrome on right 07/21/2016  . Carotid atherosclerosis 02/18/2016  . Cervical spine arthritis 02/18/2016  . Neck pain, chronic 02/16/2016  . Leukocytosis 12/19/2015  . Pelvic mass  in female 12/02/2015  . Degenerative disc disease, lumbar 11/29/2015  . Aorto-iliac atherosclerosis (HCC) 11/29/2015  . Degenerative joint disease (DJD) of hip 11/29/2015  . Low back pain 10/21/2015  . Osteoporosis 09/26/2015  . Primary osteoarthritis of both knees 09/22/2015  . Shoulder pain, left 07/22/2015  . Hyponatremia 07/22/2015  . Hyperlipidemia 07/22/2015  . Type 2 diabetes mellitus with hyperglycemia (HCC) 07/06/2015  . Essential hypertension, benign 07/06/2015  . Medication monitoring encounter 07/06/2015  . Chronic left flank pain 07/06/2015  . Psoriasis, guttate 07/06/2015  . Post-menopausal 07/06/2015  . Gait abnormality 07/06/2015  . DJD (degenerative joint disease) of knee 07/16/2013    Past Surgical History:  Procedure Laterality Date  . ABDOMINAL HYSTERECTOMY     partial, due to ovarian cancer  . APPENDECTOMY    . CATARACT EXTRACTION      Prior to Admission medications   Medication Sig Start Date End Date Taking? Authorizing Provider  chlorhexidine (PERIDEX) 0.12 % solution RINSE 15 ML PO FOR 30 SECONDS THEN SPIT OUT UTD. DO NOT RINSE WITH WATER AND DO NOT SWALLOW 06/09/17  Yes [provider]  clobetasol ointment (TEMOVATE) 0.05 % Apply 1 application topically 2 (two) times daily. 08/13/17  Yes Lada, Janit BernMelinda P, MD  diclofenac sodium (VOLTAREN) 1 % GEL Apply 2-4 grams to each knee up to four times a day 08/13/17  Yes Lada, Janit BernMelinda P, MD  glipiZIDE (GLUCOTROL) 10 MG tablet Take 10 mg by mouth 2 (two) times daily before a meal.  09/12/16 12/29/2017 Yes [provider]  linagliptin (TRADJENTA) 5 MG TABS tablet Take 5 mg by mouth daily.  09/12/16  Yes [provider]  losartan (COZAAR) 100 MG tablet TAKE 1 TABLET BY MOUTH EVERY DAY 12/28/16  Yes Lada, Janit Bern, MD  metFORMIN (GLUCOPHAGE) 1000 MG tablet TK 1 T PO BID WITH MEALS 11/01/16  Yes [provider]  pantoprazole (PROTONIX) 40 MG tablet TAKE ONE TABLET BY MOUTH ONCE DAILY 08/13/17   Yes Lada, Janit Bern, MD  PAZEO 0.7 % SOLN Place 1 drop into both eyes at bedtime. 08/11/17  Yes [provider]  amLODipine (NORVASC) 5 MG tablet Take 5 mg by mouth daily.     [provider]  aspirin EC 81 MG tablet Take 1 tablet (81 mg total) by mouth daily. 03/08/16   Kerman Passey, MD  pravastatin (PRAVACHOL) 20 MG tablet TAKE 1 TABLET BY MOUTH EVERY NIGHT AT BEDTIME 01/12/17   Lada, Janit Bern, MD    Allergies Patient has no known allergies.  Family History  Problem Relation Age of Onset  . Cancer Son 36       stomach    Social History Social History   Tobacco Use  . Smoking status: Never Smoker  . Smokeless tobacco: Never Used  Substance Use Topics  . Alcohol use: No  . Drug use: No    Review of Systems  Level 5 caveat secondary to altered mental status   ____________________________________________   PHYSICAL EXAM:  VITAL SIGNS: ED Triage Vitals  Enc Vitals Group     BP 12/31/2017 1014 (!) 99/45     Pulse Rate 12/10/2017 1014 (!) 130     Resp 12/16/2017 1014 (!) 27     Temp 12/26/2017 1014 (!) 102.2 F (39 C)     Temp Source 01/01/2018 1014 Rectal     SpO2 12/09/2017 1010 (!) 80 %     Weight 12/13/2017 1024 132 lb 0.9 oz (59.9 kg)     Height 12/27/2017 1050 5' (1.524 m)     Head Circumference --      Peak Flow --      Pain Score --      Pain Loc --      Pain Edu? --      Excl. in GC? --     Constitutional: Grimaces and moves her left arm to sternal rubbing but otherwise is unresponsive. Eyes: Conjunctivae are normal.  Head: Atraumatic. Nose: No congestion/rhinnorhea. Mouth/Throat: Mucous membranes are dry Neck: No stridor.   Cardiovascular: Tachycardic, regular rhythm. Grossly normal heart sounds.  Respiratory: Rapid and shallow breathing with rales throughout all fields.   Gastrointestinal: Soft. No distention.  Musculoskeletal: No lower extremity tenderness nor edema.  No joint effusions. Neurologic: GCS of 7. Skin:  Skin is warm, dry and  intact. No rash noted. ____________________________________________   LABS (all labs ordered are listed, but only abnormal results are displayed)  Labs Reviewed  COMPREHENSIVE METABOLIC PANEL - Abnormal; Notable for the following components:      Result Value   Potassium 3.1 (*)    Chloride 113 (*)    CO2 13 (*)    Glucose, Bld 415 (*)    BUN 33 (*)    Creatinine, Ser 1.61 (*)    Calcium 8.0 (*)    Total Protein 6.0 (*)    Albumin 2.0 (*)    ALT 13 (*)    GFR calc non Af Amer 27 (*)    GFR calc Af Amer 31 (*)  All other components within normal limits  LACTIC ACID, PLASMA - Abnormal; Notable for the following components:   Lactic Acid, Venous 2.9 (*)    All other components within normal limits  CBC WITH DIFFERENTIAL/PLATELET - Abnormal; Notable for the following components:   WBC 14.0 (*)    RBC 3.64 (*)    Hemoglobin 10.6 (*)    HCT 32.7 (*)    Neutro Abs 13.4 (*)    Lymphs Abs 0.4 (*)    Monocytes Absolute 0.1 (*)    All other components within normal limits  PROTIME-INR - Abnormal; Notable for the following components:   Prothrombin Time 16.7 (*)    All other components within normal limits  GLUCOSE, CAPILLARY - Abnormal; Notable for the following components:   Glucose-Capillary 410 (*)    All other components within normal limits  CULTURE, BLOOD (ROUTINE X 2)  CULTURE, BLOOD (ROUTINE X 2)  LACTIC ACID, PLASMA  URINALYSIS, COMPLETE (UACMP) WITH MICROSCOPIC  INFLUENZA PANEL BY PCR (TYPE A & B)   ____________________________________________  EKG  ED ECG REPORT I, Arelia Longest, the attending physician, personally viewed and interpreted this ECG.   Date: 12/10/2017  EKG Time: 1011  Rate: 130  Rhythm: sinus tachycardia  Axis: Normal  Intervals:none  ST&T Change: No ST segment elevation or depression.  No abnormal T wave inversion.  ____________________________________________  RADIOLOGY  Multifocal pneumonia on the chest x-ray.  ET tube is at the  carina ____________________________________________   PROCEDURES  Procedure(s) performed:    Procedure Name: Intubation Date/Time: 12/29/2017 11:30 AM Performed by: Myrna Blazer, MD Pre-anesthesia Checklist: Patient identified, Patient being monitored, Emergency Drugs available, Timeout performed and Suction available Oxygen Delivery Method: Non-rebreather mask Preoxygenation: Pre-oxygenation with 100% oxygen Induction Type: Rapid sequence Ventilation: Mask ventilation without difficulty Laryngoscope Size: Glidescope and 3 Tube size: 7.5 mm Number of attempts: 1 Placement Confirmation: ETT inserted through vocal cords under direct vision,  CO2 detector and Breath sounds checked- equal and bilateral Tube secured with: ETT holder Comments: After x-ray was obtained the tube was pulled back 2 cm.    .Critical Care Performed by: Myrna Blazer, MD Authorized by: Myrna Blazer, MD   Critical care provider statement:    Critical care time (minutes):  35   Critical care was necessary to treat or prevent imminent or life-threatening deterioration of the following conditions:  Sepsis   Critical care was time spent personally by me on the following activities:  Ordering and performing treatments and interventions, ordering and review of laboratory studies, ordering and review of radiographic studies, pulse oximetry, re-evaluation of patient's condition and examination of patient    Critical Care performed: Yes, see critical care note(s)  ____________________________________________   INITIAL IMPRESSION / ASSESSMENT AND PLAN / ED COURSE  Pertinent labs & imaging results that were available during my care of the patient were reviewed by me and considered in my medical decision making (see chart for details).  Differential diagnosis includes, but is not limited to, alcohol, illicit or prescription medications, or other toxic ingestion; intracranial  pathology such as stroke or intracerebral hemorrhage; fever or infectious causes including sepsis; hypoxemia and/or hypercarbia; uremia; trauma; endocrine related disorders such as diabetes, hypoglycemia, and thyroid-related diseases; hypertensive encephalopathy; etc.  Differential includes, but is not limited to, viral syndrome, bronchitis including COPD exacerbation, pneumonia, reactive airway disease including asthma, CHF including exacerbation with or without pulmonary/interstitial edema, pneumothorax, ACS, thoracic trauma, and pulmonary embolism. As part of my medical decision  making, I reviewed the following data within the electronic MEDICAL RECORD NUMBERprior records reviewed.   ----------------------------------------- 11:32 AM on 01/02/2018 -----------------------------------------  Patient at this time has been intubated.  Blood pressure is between the high 80s in the 100s.  Antibiotics given for healthcare associated pneumonia.  I spoke with the family and they state that the patient would not want to be on the ventilator for any prolonged period of time or have CPR performed.  The patient will be admitted to the hospital to the ICU.  Sepsis alert was called.  Signed out to Dr. Amado Coe.  Patient also with acidosis but with a normal anion gap.  Hyperglycemic.  Receiving fluids.   ____________________________________________   FINAL CLINICAL IMPRESSION(S) / ED DIAGNOSES  Sepsis.  Altered mental status.  Multifocal pneumonia.    NEW MEDICATIONS STARTED DURING THIS VISIT:  New Prescriptions   No medications on file     Note:  This document was prepared using Dragon voice recognition software and may include unintentional dictation errors.     Myrna Blazer, MD 12/09/2017 1133    Jaidence Geisler, Myra Rude, MD 12/17/2017 1134

## 2017-12-21 NOTE — Telephone Encounter (Signed)
I never received a call back from my initial call to ER I see now that patient is intubated, on ventilator, positive for flu I will be thinking about her and her daughter; I am available if anyone on her current hospital team wants direction or if I can help in any way; I have cared for Tasha Rivera for years. Thank you

## 2017-12-21 NOTE — Consult Note (Signed)
[facility] Consultation      Name: Bernardine Langworthy MRN: [id number] DOB: [birth year]    ADMISSION DATE:  12/07/2017 CONSULTATION DATE:  01/02/2018  REFERRING MD :  Dr. Margaretmary Eddy   CHIEF COMPLAINT:    Unresponsive   HISTORY OF PRESENT ILLNESS   Marvin Maenza  is a 84 y.o. female with a known history of hypertension, diabetes mellitus was brought into the emergency department via EMS as patient was unresponsive.  Patient was nonverbal.  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  Patient had flulike symptoms for the past 2 weeks and was not seen by any doctors.  According to the family members patient became altered for 3 hours prior to EMS arrival.  In the emergency department patient was found to be febrile, hypotensive, tachycardic with leukocytosis.  She was hypoxemic and was intubated.  Patient has received 3 L of IV fluid boluses in the emergency department and IV antibiotics were started      SIGNIFICANT EVENTS    none    PAST MEDICAL HISTORY    :  Past Medical History:  Diagnosis Date  . Carotid atherosclerosis 2017  . Diabetes mellitus without complication ([place])   . DJD (degenerative joint disease)   . History of ovarian cancer    she states approximately 60 years ago.  Marland Kitchen Hyperlipidemia   . Hypertension    Past Surgical History:  Procedure Laterality Date  . ABDOMINAL HYSTERECTOMY     partial, due to ovarian cancer  . APPENDECTOMY    . CATARACT EXTRACTION     Prior to Admission medications   Medication Sig Start Date End Date Taking? Authorizing Provider  chlorhexidine (PERIDEX) 0.12 % solution RINSE 15 ML PO FOR 30 SECONDS THEN SPIT OUT UTD. DO NOT RINSE WITH WATER AND DO NOT SWALLOW 06/09/17  Yes [provider]  clobetasol ointment (TEMOVATE) 8.25 % Apply 1 application topically 2 (two) times daily. 08/13/17  Yes Lada, Satira Anis, MD    diclofenac sodium (VOLTAREN) 1 % GEL Apply 2-4 grams to each knee up to four times a day 08/13/17  Yes Lada, Satira Anis, MD  glipiZIDE (GLUCOTROL) 10 MG tablet Take 10 mg by mouth 2 (two) times daily before a meal.  09/12/16 12/07/2017 Yes [provider]  linagliptin (TRADJENTA) 5 MG TABS tablet Take 5 mg by mouth daily.  09/12/16  Yes [provider]  losartan (COZAAR) 100 MG tablet TAKE 1 TABLET BY MOUTH EVERY DAY 12/28/16  Yes Lada, Satira Anis, MD  metFORMIN (GLUCOPHAGE) 1000 MG tablet TK 1 T PO BID WITH MEALS 11/01/16  Yes [provider]  pantoprazole (PROTONIX) 40 MG tablet TAKE ONE TABLET BY MOUTH ONCE DAILY 08/13/17  Yes Lada, Satira Anis, MD  PAZEO 0.7 % SOLN Place 1 drop into both eyes at bedtime. 08/11/17  Yes [provider]  amLODipine (NORVASC) 5 MG tablet Take 5 mg by mouth daily.     [provider]  aspirin EC 81 MG tablet Take 1 tablet (81 mg total) by mouth daily. 03/08/16   Arnetha Courser, MD  pravastatin (PRAVACHOL) 20 MG tablet TAKE 1 TABLET BY MOUTH EVERY NIGHT AT BEDTIME 01/12/17   Lada, Satira Anis, MD   No Known Allergies   FAMILY HISTORY   Family History  Problem Relation Age of Onset  . Cancer Son 40       stomach  SOCIAL HISTORY    reports that  has never smoked. she has never used smokeless tobacco. She reports that she does not drink alcohol or use drugs.  ROS Patient has been in bed for 2 weeks unwell.     VITAL SIGNS    Temp:  [100 F (37.8 C)-102.2 F (39 C)] 100 F (37.8 C) (02/15 1223) Pulse Rate:  [111-134] 114 (02/15 1500) Resp:  [13-29] 15 (02/15 1500) BP: (61-121)/(39-70) 88/48 (02/15 1500) SpO2:  [80 %-100 %] 95 % (02/15 1500) FiO2 (%):  [50 %-100 %] 60 % (02/15 1511) Weight:  [119 lb 0.8 oz (54 kg)-132 lb 0.9 oz (59.9 kg)] 119 lb 0.8 oz (54 kg) (02/15 1223) HEMODYNAMICS:   VENTILATOR SETTINGS: Vent Mode: PRVC FiO2 (%):  [50 %-100 %] 60 % Set Rate:  [15 bmp] 15 bmp Vt Set:  [450 mL] 450  mL PEEP:  [5 cmH20] 5 cmH20 INTAKE / OUTPUT:  Intake/Output Summary (Last 24 hours) at 12/31/2017 1635 Last data filed at 12/27/2017 1500 Gross per 24 hour  Intake 4265.87 ml  Output 300 ml  Net 3965.87 ml       PHYSICAL EXAM   Physical Exam  GENERAL:  No acute distress. On vent EYES: Pupils unequal, Left-round, right-elliptical reactive to light and accommodation. No scleral icterus. HEENT: Head atraumatic, normocephalic. Oropharynx with ET tube NECK:  Supple, no jugular venous distention. No thyroid enlargement, no tenderness.  LUNGS: Coarse crackly breath sounds bilaterally, no wheezing, rales, No use of accessory muscles of respiration.  CARDIOVASCULAR: S1, S2 normal. No murmurs, rubs, or gallops.  ABDOMEN: Soft, nontender, nondistended. Bowel sounds inaudible.  EXTREMITIES: No pedal edema, cyanosis, or clubbing.  NEUROLOGIC: Patient is encephalopathic and intubated PSYCHIATRIC: The patient is encephalopathic  sKIN: No obvious rash, lesion, or ulcer.       LABS   LABS:  CBC Recent Labs  Lab 12/20/2017 1020  WBC 14.0*  HGB 10.6*  HCT 32.7*  PLT 275   Coag's Recent Labs  Lab 12/20/2017 1020  INR 1.36   BMET Recent Labs  Lab 12/10/2017 1020  NA 139  K 3.1*  CL 113*  CO2 13*  BUN 33*  CREATININE 1.61*  GLUCOSE 415*   Electrolytes Recent Labs  Lab 12/16/2017 1020  CALCIUM 8.0*   Sepsis Markers Recent Labs  Lab 12/20/2017 1020 12/19/2017 1239  LATICACIDVEN 2.9* 2.7*   ABG No results for input(s): PHART, PCO2ART, PO2ART in the last 168 hours. Liver Enzymes Recent Labs  Lab 12/09/2017 1020  AST 26  ALT 13*  ALKPHOS 76  BILITOT 1.2  ALBUMIN 2.0*   Cardiac Enzymes No results for input(s): TROPONINI, PROBNP in the last 168 hours. Glucose Recent Labs  Lab 01/02/2018 1010 12/12/2017 1215 12/11/2017 1617  GLUCAP 410* 395* 342*     Recent Results (from the past 240 hour(s))  MRSA PCR Screening     Status: None   Collection Time: 12/22/2017 10:09  AM  Result Value Ref Range Status   MRSA by PCR NEGATIVE NEGATIVE Final    Comment:        The GeneXpert MRSA Assay (FDA approved for NASAL specimens only), is one component of a comprehensive MRSA colonization surveillance program. It is not intended to diagnose MRSA infection nor to guide or monitor treatment for MRSA infections. Performed at [facility], [address], [place], [place] [address]      Current Facility-Administered Medications:  .  0.9 %  sodium chloride infusion, 250 mL, Intravenous,  PRN, Lafayette Dragon, MD .  0.9 % NaCl with KCl 20 mEq/ L  infusion, , Intravenous, Continuous, Gouru, Aruna, MD, Last Rate: 125 mL/hr at 12/18/2017 1500 .  [START ON 12/22/2017] ceFEPIme (MAXIPIME) 1 g in sodium chloride 0.9 % 100 mL IVPB, 1 g, Intravenous, Q24H, Zael Shuman A, MD .  chlorhexidine gluconate (MEDLINE KIT) (PERIDEX) 0.12 % solution 15 mL, 15 mL, Mouth Rinse, BID, Cammie Sickle A, MD .  enoxaparin (LOVENOX) injection 30 mg, 30 mg, Subcutaneous, Q24H, Gouru, Aruna, MD, 30 mg at 01/02/2018 1257 .  insulin aspart (novoLOG) injection 1-3 Units, 1-3 Units, Subcutaneous, Q4H, Lafayette Dragon, MD .  MEDLINE mouth rinse, 15 mL, Mouth Rinse, QID, Lafayette Dragon, MD, 15 mL at 12/22/2017 1626 .  norepinephrine (LEVOPHED) 4 mg in dextrose 5 % 250 mL (0.016 mg/mL) infusion, 0-40 mcg/min, Intravenous, Titrated, Lafayette Dragon, MD, Last Rate: 56.3 mL/hr at 12/28/2017 1531, 15 mcg/min at 12/28/2017 1531 .  ondansetron (ZOFRAN) tablet 4 mg, 4 mg, Oral, Q6H PRN **OR** ondansetron (ZOFRAN) injection 4 mg, 4 mg, Intravenous, Q6H PRN, Gouru, Aruna, MD .  oseltamivir (TAMIFLU) 6 MG/ML suspension 45 mg, 45 mg, Oral, Daily, Lafayette Dragon, MD, 45 mg at 12/20/2017 1620 .  pantoprazole (PROTONIX) injection 40 mg, 40 mg, Intravenous, Q24H, Gouru, Aruna, MD, 40 mg at 12/10/2017 1256 .  [START ON 01/20/18] vancomycin (VANCOCIN) IVPB 750 mg/150 ml premix, 750 mg, Intravenous, Q48H,  Lafayette Dragon, MD  IMAGING    Dg Abdomen 1 View  Result Date: 12/12/2017 CLINICAL DATA:  OG tube placement EXAM: ABDOMEN - 1 VIEW COMPARISON:  10/31/2016 FINDINGS: Enteric tube tip is in the distal stomach. Nonobstructive bowel gas pattern. IMPRESSION: Enteric tube tip in the distal stomach. Electronically Signed   By: Rolm Baptise M.D.   On: 12/30/2017 10:52   Ct Head Wo Contrast  Result Date: 12/29/2017 CLINICAL DATA:  Altered level of consciousness. Patient found unresponsive at home. Patient is intubated. EXAM: CT HEAD WITHOUT CONTRAST TECHNIQUE: Contiguous axial images were obtained from the base of the skull through the vertex without intravenous contrast. COMPARISON:  None. FINDINGS: Brain: Mild atrophy and white matter changes are present bilaterally. Basal ganglia are intact. Insular ribbon is normal. No acute infarct, hemorrhage, or mass lesion is present. The brainstem and cerebellum are normal. Vascular: Vascular calcifications are present within the cavernous internal carotid arteries and at the dural margin of the left vertebral artery. There is no hyperdense vessel. Skull: Calvarium is intact. No focal lytic or blastic lesions are present. Extracranial soft tissues are within normal limits. Sinuses/Orbits: A fluid level is present in the right sphenoid sinus. The remaining paranasal sinuses and the mastoid air cells are clear. IMPRESSION: 1. Mild atrophy and white matter disease. This likely reflects the sequela of chronic microvascular ischemia. 2. No acute intracranial abnormality. 3. Right sphenoid sinus disease. Electronically Signed   By: San Morelle M.D.   On: 12/16/2017 11:28   Dg Chest Portable 1 View  Result Date: 01/03/2018 CLINICAL DATA:  Hypoxia EXAM: PORTABLE CHEST 1 VIEW COMPARISON:  October 31, 2016 FINDINGS: Endotracheal tube tip is at the carina.  No pneumothorax. There is airspace consolidation in the right upper lobe as well as in portions of the lingula  and left lower lobe. Lungs elsewhere clear. Heart size and pulmonary vascularity are normal. There is aortic atherosclerosis. No adenopathy evident. No evident bone lesions. IMPRESSION: Endotracheal tube tip is at the carina. Advise withdrawing endotracheal tube approximately  3 cm. No pneumothorax. There is airspace consolidation in portions of the right upper lobe, lingula, and left lower lobe, consistent with multifocal pneumonia. Heart size normal. No adenopathy evident. There is aortic atherosclerosis. Aortic Atherosclerosis (ICD10-I70.0). These results were called by telephone at the time of interpretation on 12/31/2017 at 10:50 am to Dr. Larae Grooms , who verbally acknowledged these results. Electronically Signed   By: Lowella Grip III M.D.   On: 12/28/2017 10:51      Indwelling Urinary Catheter continued, requirement due to   Reason to continue Indwelling Urinary Catheter for strict Intake/Output monitoring for hemodynamic instability   Central Line continued, requirement due to   Reason to continue [employer] Monitoring of central venous pressure or other hemodynamic parameters   Ventilator continued, requirement due to, resp failure    Ventilator Sedation RASS 0 to -2   MAJOR EVENTS/TEST RESULTS:   INDWELLING DEVICES::  MICRO DATA: MRSA PCR  Urine  Blood Resp   ANTIMICROBIALS:     ASSESSMENT/PLAN   1. Unresponsiveness/ metabolic encephalopathy secondary to septic shock. Hypoperfusion from hypotension. 2. Acute Respiratory failure with multifocal community acquired Influenza Pneumonia.. 3. Sepsis with shock secondary to multifocal, bilateral Pneumonia. 4. Acute kidney Injury- from hypovolemia and sepsis 5. Hypokalemia  Plans:  1. IVF resuscitation. Follow up lactic acid levels 2. ABX- Rocephin, Azithromycin, Tamiflu 3. Vasopressor support, insert central line 4. Target Glucose management 5. GI and DVT prophylaxis 6. CVP monitor for fluid  resuscitation 7. Will start enteral nutritional tomorrow. 8. Electro;ytes replacement. 9. Follow lactic acid levels and response to IVF.    I have personally obtained a history, examined the patient, evaluated laboratory and independently reviewed  imaging results, formulated the assessment and plan and placed orders.  The Patient requires high complexity decision making for assessment and support, frequent evaluation and titration of therapies, application of advanced monitoring technologies and extensive interpretation of multiple databases. Critical Care Time devoted to patient care services described in this note is 60 minutes.   Overall, patient is critically ill, prognosis is guarded. Patient at high risk for cardiac arrest and death. Family has been updated via video interpreter.  Family has changed code status to DNR in keeping with patient's wishes.   Cammie Sickle, M.D.  [facility]

## 2017-12-21 NOTE — H&P (Signed)
[facility] [place] NAME: Tasha Rivera    MR#:  [id number]  DATE OF BIRTH:  07/29/1924  DATE OF ADMISSION:  12/31/2017  PRIMARY CARE PHYSICIAN: Arnetha Courser, MD   REQUESTING/REFERRING PHYSICIAN: Orbie Pyo, MD  CHIEF COMPLAINT:   Unresponsive HISTORY OF PRESENT ILLNESS:  Tasha Rivera  is a 84 y.o. female with a known history of hypertension, diabetes mellitus was brought into the emergency department via EMS as patient was unresponsive.  Patient was nonverbal.  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  Patient had flulike symptoms for the past 2 weeks and was not seen by any doctors.  According to the family members patient became altered for 3 hours prior to EMS arrival.  In the emergency department patient was found to be febrile, hypotensive, tachycardic with leukocytosis.  She was hypoxemic and was intubated.  Patient has received 3 L of IV fluid boluses in the emergency department and IV antibiotics were started  PAST MEDICAL HISTORY:   Past Medical History:  Diagnosis Date  . Carotid atherosclerosis 2017  . Diabetes mellitus without complication ([place])   . DJD (degenerative joint disease)   . History of ovarian cancer    she states approximately 60 years ago.  Marland Kitchen Hyperlipidemia   . Hypertension     PAST SURGICAL HISTOIRY:   Past Surgical History:  Procedure Laterality Date  . ABDOMINAL HYSTERECTOMY     partial, due to ovarian cancer  . APPENDECTOMY    . CATARACT EXTRACTION      SOCIAL HISTORY:   Social History   Tobacco Use  . Smoking status: Never Smoker  . Smokeless tobacco: Never Used  Substance Use Topics  . Alcohol use: No    FAMILY HISTORY:   Family History  Problem Relation Age of Onset  . Cancer Son 40       stomach    DRUG ALLERGIES:  No Known Allergies  REVIEW OF SYSTEMS:   Patient is intubated  MEDICATIONS AT HOME:   Prior to Admission medications   Medication Sig Start Date End Date Taking? Authorizing Provider  chlorhexidine (PERIDEX) 0.12 % solution RINSE 15 ML PO FOR 30 SECONDS THEN SPIT OUT UTD. DO NOT RINSE WITH WATER AND DO NOT SWALLOW 06/09/17  Yes [provider]  clobetasol ointment (TEMOVATE) 3.72 % Apply 1 application topically 2 (two) times daily. 08/13/17  Yes Lada, Satira Anis, MD  diclofenac sodium (VOLTAREN) 1 % GEL Apply 2-4 grams to each knee up to four times a day 08/13/17  Yes Lada, Satira Anis, MD  glipiZIDE (GLUCOTROL) 10 MG tablet Take 10 mg by mouth 2 (two) times daily before a meal.  09/12/16 12/31/2017 Yes [provider]  linagliptin (TRADJENTA) 5 MG TABS tablet Take 5 mg by mouth daily.  09/12/16  Yes [provider]  losartan (COZAAR) 100 MG tablet TAKE 1 TABLET BY MOUTH EVERY DAY 12/28/16  Yes Lada, Satira Anis, MD  metFORMIN (GLUCOPHAGE) 1000 MG tablet TK 1 T PO BID WITH MEALS 11/01/16  Yes [provider]  pantoprazole (PROTONIX) 40 MG tablet TAKE ONE TABLET BY MOUTH ONCE DAILY 08/13/17  Yes Lada, Satira Anis, MD  PAZEO 0.7 % SOLN Place 1 drop into both eyes at bedtime. 08/11/17  Yes [provider]  amLODipine (NORVASC) 5 MG tablet Take 5 mg by mouth daily.  [provider]  aspirin EC 81 MG tablet Take 1 tablet (81 mg total) by mouth daily. 03/08/16   Lada, Satira Anis, MD  pravastatin (PRAVACHOL) 20 MG tablet TAKE 1 TABLET BY MOUTH EVERY NIGHT AT BEDTIME 01/12/17   Lada, Satira Anis, MD      VITAL SIGNS:  Blood pressure (!) 107/49, pulse (!) 122, temperature (!) 100.6 F (38.1 C), resp. rate 15, height 5' (1.524 m), weight 59.9 kg (132 lb 0.9 oz), SpO2 97 %.  PHYSICAL EXAMINATION:  GENERAL:  84 y.o.-year-old patient lying in the bed with no acute distress.  EYES: Pupils equal, round, reactive to light and accommodation. No scleral icterus. Extraocular muscles intact.  HEENT: Head atraumatic,  normocephalic. Oropharynx with ET tube NECK:  Supple, no jugular venous distention. No thyroid enlargement, no tenderness.  LUNGS: Coarse crackly breath sounds bilaterally, no wheezing, rales, No use of accessory muscles of respiration.  CARDIOVASCULAR: S1, S2 normal. No murmurs, rubs, or gallops.  ABDOMEN: Soft, nontender, nondistended. Bowel sounds present.  EXTREMITIES: No pedal edema, cyanosis, or clubbing.  NEUROLOGIC: Patient is encephalopathic and intubated PSYCHIATRIC: The patient is encephalopathic  sKIN: No obvious rash, lesion, or ulcer.   LABORATORY PANEL:   CBC Recent Labs  Lab 12/09/2017 1020  WBC 14.0*  HGB 10.6*  HCT 32.7*  PLT 275   ------------------------------------------------------------------------------------------------------------------  Chemistries  Recent Labs  Lab 12/17/2017 1020  NA 139  K 3.1*  CL 113*  CO2 13*  GLUCOSE 415*  BUN 33*  CREATININE 1.61*  CALCIUM 8.0*  AST 26  ALT 13*  ALKPHOS 76  BILITOT 1.2   ------------------------------------------------------------------------------------------------------------------  Cardiac Enzymes No results for input(s): TROPONINI in the last 168 hours. ------------------------------------------------------------------------------------------------------------------  RADIOLOGY:  Dg Abdomen 1 View  Result Date: 12/16/2017 CLINICAL DATA:  OG tube placement EXAM: ABDOMEN - 1 VIEW COMPARISON:  10/31/2016 FINDINGS: Enteric tube tip is in the distal stomach. Nonobstructive bowel gas pattern. IMPRESSION: Enteric tube tip in the distal stomach. Electronically Signed   By: Rolm Baptise M.D.   On: 12/24/2017 10:52   Ct Head Wo Contrast  Result Date: 12/10/2017 CLINICAL DATA:  Altered level of consciousness. Patient found unresponsive at home. Patient is intubated. EXAM: CT HEAD WITHOUT CONTRAST TECHNIQUE: Contiguous axial images were obtained from the base of the skull through the vertex without  intravenous contrast. COMPARISON:  None. FINDINGS: Brain: Mild atrophy and white matter changes are present bilaterally. Basal ganglia are intact. Insular ribbon is normal. No acute infarct, hemorrhage, or mass lesion is present. The brainstem and cerebellum are normal. Vascular: Vascular calcifications are present within the cavernous internal carotid arteries and at the dural margin of the left vertebral artery. There is no hyperdense vessel. Skull: Calvarium is intact. No focal lytic or blastic lesions are present. Extracranial soft tissues are within normal limits. Sinuses/Orbits: A fluid level is present in the right sphenoid sinus. The remaining paranasal sinuses and the mastoid air cells are clear. IMPRESSION: 1. Mild atrophy and white matter disease. This likely reflects the sequela of chronic microvascular ischemia. 2. No acute intracranial abnormality. 3. Right sphenoid sinus disease. Electronically Signed   By: San Morelle M.D.   On: 12/18/2017 11:28   Dg Chest Portable 1 View  Result Date: 12/24/2017 CLINICAL DATA:  Hypoxia EXAM: PORTABLE CHEST 1 VIEW COMPARISON:  October 31, 2016 FINDINGS: Endotracheal tube tip is at the carina.  No pneumothorax. There is airspace consolidation in the right upper lobe as well as in portions of the lingula  and left lower lobe. Lungs elsewhere clear. Heart size and pulmonary vascularity are normal. There is aortic atherosclerosis. No adenopathy evident. No evident bone lesions. IMPRESSION: Endotracheal tube tip is at the carina. Advise withdrawing endotracheal tube approximately 3 cm. No pneumothorax. There is airspace consolidation in portions of the right upper lobe, lingula, and left lower lobe, consistent with multifocal pneumonia. Heart size normal. No adenopathy evident. There is aortic atherosclerosis. Aortic Atherosclerosis (ICD10-I70.0). These results were called by telephone at the time of interpretation on 12/16/2017 at 10:50 am to Dr. Larae Grooms , who verbally acknowledged these results. Electronically Signed   By: Lowella Grip III M.D.   On: 12/26/2017 10:51    EKG:   Orders placed or performed during the hospital encounter of 12/27/2017  . EKG 12-Lead  . EKG 12-Lead    IMPRESSION AND PLAN:     #Acute hypoxic respiratory failure secondary to pneumonia Admit to intensive care unit Vent management per protocol Discussed with intensivist Dr. Celesta Aver  #Acute metabolic encephalopathy secondary to septic shock from pneumonia Patient is currently intubated IV fluids per sepsis protocol Patient met septic criteria with hypotension, tachycardia, fever and leukocytosis Blood cultures, urine culture and sensitivity and sputum cultures were ordered and patient is started on broad-spectrum IV antibiotics   #Pneumonia Patient is started on IV cefepime and vancomycin Check for flu We will check streptococcal antigen and urine for Legionella IV fluids  #Hypokalemia Replete and recheck  #Acute kidney injury Hydrate with IV fluids  GI prophylaxis with Protonix DVT prophylaxis Lovenox subcu  All the records are reviewed and case discussed with ED provider. Management plans discussed with the patient's 2 daughters and son-in-law with the help of [place] interpreter and they are in agreement.  CODE STATUS: Full code, daughter  Tasha Rivera is a healthcare power of attorney  TOTAL CRITICAL CARE TIME TAKING CARE OF THIS PATIENT: 55 minutes.   Note: This dictation was prepared with Dragon dictation along with smaller phrase technology. Any transcriptional errors that result from this process are unintentional.  Nicholes Mango M.D on 12/24/2017 at 12:13 PM  Between 7am to 6pm - Pager - [phone]  After 6pm go to [email or web address] - password [email or web address] [facility]  Office  [phone]  CC: Primary care physician; Arnetha Courser, MD

## 2017-12-21 NOTE — Progress Notes (Signed)
Initial Nutrition Assessment  DOCUMENTATION CODES:   Not applicable  INTERVENTION:  Would not recommend EN until Levo gtt is <655mcg/min  NUTRITION DIAGNOSIS:   Inadequate oral intake related to inability to eat as evidenced by NPO status  GOAL:   Provide needs based on ASPEN/SCCM guidelines  MONITOR:   I & O's, Vent status, Labs, TF tolerance, Weight trends  REASON FOR ASSESSMENT:   Ventilator    ASSESSMENT:   Tasha Rivera  is a 84 y.o. female with a known history of hypertension, diabetes mellitus was brought into the emergency department via EMS as patient was unresponsive.  Patient was nonverbal.  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 Had flu-like symptoms for 2 weeks, in ED was tachycardic, hypotenisve, and febrile with leukocytosis. Found with Acute metabolic encephalopathy secondary to septic shock from pneumonia, AKI.  Attempted to speak Tasha Rivera's family at bedside via interpreter twice. Family was leaving during second visit and would not talk to me. Per chart appears patient's weight was stable until 08/13/2017, was 138 pounds, now 119 pounds, a 19 pound/14% severe weight loss over 4 months. Physical exam is WNL. Unsure of cause of weight loss but according to H&P She stopped taking her medications for approximately 5 months for religious reasons. Patient was not in DKA upon admit but does have heavily elevated CBGs and glucose in urine, indicating she may have stopped her diabetic medicines and lost weight as a result.  Patient is currently intubated on ventilator support MV: 6.3 L/min Temp (24hrs), Avg:100.9 F (38.3 C), Min:100 F (37.8 C), Max:102.2 F (39 C) Propofol - none  NUTRITION - FOCUSED PHYSICAL EXAM:    Most Recent Value  Orbital Region  No depletion  Upper Arm Region  No depletion  Thoracic and Lumbar Region  No depletion  Buccal Region  Mild  depletion  Temple Region  No depletion  Clavicle Bone Region  Mild depletion  Clavicle and Acromion Bone Region  Mild depletion  Scapular Bone Region  No depletion  Dorsal Hand  No depletion  Patellar Region  No depletion  Anterior Thigh Region  No depletion  Posterior Calf Region  Mild depletion  Edema (RD Assessment)  None  Hair  Reviewed  Eyes  Reviewed  Mouth  Reviewed  Skin  Reviewed  Nails  Reviewed       Diet Order:  Diet NPO time specified  EDUCATION NEEDS:   Not appropriate for education at this time  Skin:  Skin Assessment: Reviewed RN Assessment  Last BM:  PTA  Height:   Ht Readings from Last 1 Encounters:  12/27/2017 5' (1.524 m)    Weight:   Wt Readings from Last 1 Encounters:  01/03/2018 119 lb 0.8 oz (54 kg)    Ideal Body Weight:  45.45 kg  BMI:  Body mass index is 23.25 kg/m.  Estimated Nutritional Needs:   Kcal:  1344 calories  Protein:  65-81 grams (1.2-1.5g/kg)  Fluid:  per MD  Dionne AnoWilliam M. Jakarie Pember, MS, RD LDN Inpatient Clinical Dietitian Pager [phone]

## 2017-12-21 NOTE — Procedures (Deleted)
Central Venous Catheter Placement:TRIPLE LUMEN Indication: Patient receiving vesicant or irritant drug.  Consent:signed by family, in chart    Hand washing performed prior to starting the procedure.   Procedure:   An active timeout was performed and correct patient, name, & ID confirmed.   Patient was positioned correctly for central venous access.  Patient was prepped using strict sterile technique including chlorohexadine preps, sterile drape, sterile gown and sterile gloves.    The area was prepped, draped and anesthetized in the usual sterile manner. Patient comfort was obtained.    A triple lumen catheter was placed in Right Internal Jugular Vein There was good blood return, catheter caps were placed on lumens, catheter flushed easily, the line was secured and a sterile dressing and BIO-PATCH applied.   Ultrasound was used to visualize vasculature and guidance of needle.   Number of Attempts: 1 Complications:none Estimated Blood Loss: none Chest Radiograph indicated and ordered.  Operator: Kailly Richoux/Richards supervising    Tasha Rivera Reshma Hoey, PA-S

## 2017-12-21 NOTE — Telephone Encounter (Signed)
I received a notification that my DO NOT RESUSCITATE ORDER had been canceled I reviewed patient's chart to see why I see that she is in the ER, team preparing to intubate her I called and spoke with staff member; explained that she and I have talked about aggressive measures in the past; asked if I could speak with one of the team caring for her; they were busy, but I gave my personal cell number to staff in ER so they can call me I also asked for chaplain support (already there) since she is a [place] I also asked for [place] interpreter if not already there; they have paged for that she says

## 2017-12-21 NOTE — ED Triage Notes (Signed)
Pt to ED via [facility] from home for Altered mental status, per EMS upon their arrival pt found in bed unresponsive, family reports pt has been in bed for 15 days. Pt tachycardic and warm to touch

## 2017-12-21 NOTE — Progress Notes (Signed)
Pharmacy Antibiotic Note  Tasha Rivera is a 84 y.o. female admitted on 12/16/2017 with sepsis.  Pharmacy has been consulted for cefepime/vancomcyin dosing. Patient ordered vancomycin 1g IV x 1 and cefepime 1g IV x 1. Patient is also ordered oseltamivir and is influenza A positive.   Plan: Will continue patient on cefepime 1g IV q24hr.   Will continue vancomycin 750mg  IV Q48hr for goal trough of 15-20. Will schedule next dose of vancomycin for 1000 on 2/18. Will obtain trough as clinically indicated. Recommend discontinuing vancomycin if MRSA PCR is negative.   Height: 5' (152.4 cm) Weight: 119 lb 0.8 oz (54 kg) IBW/kg (Calculated) : 45.5  Temp (24hrs), Avg:100.9 F (38.3 C), Min:100 F (37.8 C), Max:102.2 F (39 C)  Recent Labs  Lab 12/20/2017 1020 12/22/2017 1239  WBC 14.0*  --   CREATININE 1.61*  --   LATICACIDVEN 2.9* 2.7*    Estimated Creatinine Clearance: 16.3 mL/min (A) (by C-G formula based on SCr of 1.61 mg/dL (H)).    No Known Allergies  Antimicrobials this admission: Vancomycin 2/15 >>  Cefepime 2/15 >>  Oseltamivir 2/15 >>  Dose adjustments this admission: N/A  Microbiology results: 2/15 BCx: sent 2/15 Sputum: sent  2/15 MRSA PCR: negative  Thank you for allowing pharmacy to be a part of this patient's care.  Simpson,Michael L 12/26/2017 4:24 PM

## 2017-12-21 NOTE — Progress Notes (Signed)
Inpatient Diabetes Program Recommendations  AACE/ADA: New Consensus Statement on Inpatient Glycemic Control (2015)  Target Ranges:  Prepandial:   less than 140 mg/dL      Peak postprandial:   less than 180 mg/dL (1-2 hours)      Critically ill patients:  140 - 180 mg/dL   Lab Results  Component Value Date   GLUCAP 395 (H) 05-18-2018   HGBA1C 7.5 08/09/2017    Review of Glycemic Control  Results for Tasha Rivera, Tasha Rivera (MRN [id number]) as of 05-20-18 12:57  Ref. Range 05-20-18 10:10 05-20-18 12:15  Glucose-Capillary Latest Ref Range: 65 - 99 mg/dL 409410 (H) 811395 (H)    Diabetes history: Type 2 Outpatient Diabetes medications: none recently x 5 months from MD note Current orders for Inpatient glycemic control: Novolog 0-9 units tid  Inpatient Diabetes Program Recommendations:  Please order the ICU Glycemic Control order set since patient is vented and CBG is 395 mg/dl.   Susette RacerJulie Lejon Afzal, RN, BA, MHA, CDE Diabetes Coordinator Inpatient Diabetes Program  [phone] (Team Pager) [phone] [facility] Office) 05-20-18 1:04 PM

## 2017-12-22 ENCOUNTER — Inpatient Hospital Stay: Payer: Medicare Other

## 2017-12-22 DIAGNOSIS — J189 Pneumonia, unspecified organism: Secondary | ICD-10-CM

## 2017-12-22 DIAGNOSIS — J15211 Pneumonia due to Methicillin susceptible Staphylococcus aureus: Secondary | ICD-10-CM

## 2017-12-22 LAB — BLOOD GAS, ARTERIAL
ACID-BASE DEFICIT: 12.5 mmol/L — AB (ref 0.0–2.0)
Acid-base deficit: 12.1 mmol/L — ABNORMAL HIGH (ref 0.0–2.0)
Acid-base deficit: 7.7 mmol/L — ABNORMAL HIGH (ref 0.0–2.0)
BICARBONATE: 20.1 mmol/L (ref 20.0–28.0)
Bicarbonate: 14.3 mmol/L — ABNORMAL LOW (ref 20.0–28.0)
Bicarbonate: 15.2 mmol/L — ABNORMAL LOW (ref 20.0–28.0)
FIO2: 0.7
FIO2: 1
FIO2: 0.9
LHR: 20 {breaths}/min
MECHVT: 450 mL
Mechanical Rate: 20
O2 SAT: 71.4 %
O2 SAT: 88.3 %
O2 SAT: 65.9 %
PATIENT TEMPERATURE: 37
PATIENT TEMPERATURE: 37
PCO2 ART: 35 mmHg (ref 32.0–48.0)
PCO2 ART: 39 mmHg (ref 32.0–48.0)
PCO2 ART: 49 mmHg — AB (ref 32.0–48.0)
PEEP/CPAP: 8 cmH2O
PEEP: 8 cmH2O
PEEP: 8 cmH2O
PH ART: 7.22 — AB (ref 7.350–7.450)
PO2 ART: 68 mmHg — AB (ref 83.0–108.0)
Patient temperature: 37
RATE: 20 resp/min
VT: 450 mL
VT: 360 mL
pH, Arterial: 7.2 — ABNORMAL LOW (ref 7.350–7.450)
pH, Arterial: 7.22 — ABNORMAL LOW (ref 7.350–7.450)
pO2, Arterial: 46 mmHg — ABNORMAL LOW (ref 83.0–108.0)
pO2, Arterial: 42 mmHg — ABNORMAL LOW (ref 83.0–108.0)

## 2017-12-22 LAB — CBC
HEMATOCRIT: 36.4 % (ref 35.0–47.0)
Hemoglobin: 12 g/dL (ref 12.0–16.0)
MCH: 28.6 pg (ref 26.0–34.0)
MCHC: 32.8 g/dL (ref 32.0–36.0)
MCV: 87.1 fL (ref 80.0–100.0)
PLATELETS: 193 10*3/uL (ref 150–440)
RBC: 4.18 MIL/uL (ref 3.80–5.20)
RDW: 13.3 % (ref 11.5–14.5)
WBC: 12.6 10*3/uL — AB (ref 3.6–11.0)

## 2017-12-22 LAB — GLUCOSE, CAPILLARY
GLUCOSE-CAPILLARY: 222 mg/dL — AB (ref 65–99)
GLUCOSE-CAPILLARY: 223 mg/dL — AB (ref 65–99)
GLUCOSE-CAPILLARY: 244 mg/dL — AB (ref 65–99)
GLUCOSE-CAPILLARY: 245 mg/dL — AB (ref 65–99)
GLUCOSE-CAPILLARY: 248 mg/dL — AB (ref 65–99)
GLUCOSE-CAPILLARY: 252 mg/dL — AB (ref 65–99)
GLUCOSE-CAPILLARY: 260 mg/dL — AB (ref 65–99)
GLUCOSE-CAPILLARY: 262 mg/dL — AB (ref 65–99)
GLUCOSE-CAPILLARY: 306 mg/dL — AB (ref 65–99)
Glucose-Capillary: 146 mg/dL — ABNORMAL HIGH (ref 65–99)
Glucose-Capillary: 184 mg/dL — ABNORMAL HIGH (ref 65–99)
Glucose-Capillary: 243 mg/dL — ABNORMAL HIGH (ref 65–99)
Glucose-Capillary: 250 mg/dL — ABNORMAL HIGH (ref 65–99)
Glucose-Capillary: 257 mg/dL — ABNORMAL HIGH (ref 65–99)
Glucose-Capillary: 270 mg/dL — ABNORMAL HIGH (ref 65–99)
Glucose-Capillary: 271 mg/dL — ABNORMAL HIGH (ref 65–99)
Glucose-Capillary: 274 mg/dL — ABNORMAL HIGH (ref 65–99)

## 2017-12-22 LAB — RENAL FUNCTION PANEL
ALBUMIN: 1.2 g/dL — AB (ref 3.5–5.0)
Anion gap: 11 (ref 5–15)
BUN: 36 mg/dL — AB (ref 6–20)
CALCIUM: 5.7 mg/dL — AB (ref 8.9–10.3)
CO2: 20 mmol/L — AB (ref 22–32)
Chloride: 108 mmol/L (ref 101–111)
Creatinine, Ser: 1.98 mg/dL — ABNORMAL HIGH (ref 0.44–1.00)
GFR calc Af Amer: 24 mL/min — ABNORMAL LOW (ref >60)
GFR calc non Af Amer: 21 mL/min — ABNORMAL LOW (ref >60)
GLUCOSE: 292 mg/dL — AB (ref 65–99)
PHOSPHORUS: 5.1 mg/dL — AB (ref 2.5–4.6)
Potassium: 3.7 mmol/L (ref 3.5–5.1)
SODIUM: 139 mmol/L (ref 135–145)

## 2017-12-22 LAB — BLOOD GAS, VENOUS
ACID-BASE DEFICIT: 11.4 mmol/L — AB (ref 0.0–2.0)
Bicarbonate: 16.8 mmol/L — ABNORMAL LOW (ref 20.0–28.0)
FIO2: 1
MECHVT: 360 mL
O2 Saturation: 43.4 %
PATIENT TEMPERATURE: 37
PCO2 VEN: 46 mmHg (ref 44.0–60.0)
PEEP: 8 cmH2O
RATE: 20 resp/min
pH, Ven: 7.17 — CL (ref 7.250–7.430)
pO2, Ven: 32 mmHg (ref 32.0–45.0)

## 2017-12-22 LAB — BASIC METABOLIC PANEL
ANION GAP: 14 (ref 5–15)
ANION GAP: 14 (ref 5–15)
BUN: 35 mg/dL — ABNORMAL HIGH (ref 6–20)
BUN: 38 mg/dL — AB (ref 6–20)
CALCIUM: 6.1 mg/dL — AB (ref 8.9–10.3)
CO2: 18 mmol/L — ABNORMAL LOW (ref 22–32)
CO2: 22 mmol/L (ref 22–32)
CREATININE: 2.16 mg/dL — AB (ref 0.44–1.00)
Calcium: 5.9 mg/dL — CL (ref 8.9–10.3)
Chloride: 107 mmol/L (ref 101–111)
Chloride: 101 mmol/L (ref 101–111)
Creatinine, Ser: 2.24 mg/dL — ABNORMAL HIGH (ref 0.44–1.00)
GFR calc Af Amer: 21 mL/min — ABNORMAL LOW (ref >60)
GFR calc non Af Amer: 18 mL/min — ABNORMAL LOW (ref >60)
GFR, EST AFRICAN AMERICAN: 22 mL/min — AB (ref >60)
GFR, EST NON AFRICAN AMERICAN: 19 mL/min — AB (ref >60)
GLUCOSE: 207 mg/dL — AB (ref 65–99)
Glucose, Bld: 296 mg/dL — ABNORMAL HIGH (ref 65–99)
POTASSIUM: 3.5 mmol/L (ref 3.5–5.1)
Potassium: 3.9 mmol/L (ref 3.5–5.1)
SODIUM: 139 mmol/L (ref 135–145)
Sodium: 137 mmol/L (ref 135–145)

## 2017-12-22 LAB — MAGNESIUM
Magnesium: 1.7 mg/dL (ref 1.7–2.4)
Magnesium: 1.5 mg/dL — ABNORMAL LOW (ref 1.7–2.4)
Magnesium: 1.9 mg/dL (ref 1.7–2.4)

## 2017-12-22 LAB — SODIUM, URINE, RANDOM: SODIUM UR: 107 mmol/L

## 2017-12-22 LAB — HEMOGLOBIN A1C
Hgb A1c MFr Bld: 14.5 % — ABNORMAL HIGH (ref 4.8–5.6)
Mean Plasma Glucose: 369.45 mg/dL

## 2017-12-22 LAB — PHOSPHORUS: Phosphorus: 5.1 mg/dL — ABNORMAL HIGH (ref 2.5–4.6)

## 2017-12-22 LAB — CK: Total CK: 198 U/L (ref 38–234)

## 2017-12-22 LAB — LACTIC ACID, PLASMA: LACTIC ACID, VENOUS: 8.7 mmol/L — AB (ref 0.5–1.9)

## 2017-12-22 MED ORDER — FUROSEMIDE 10 MG/ML IJ SOLN
60.0000 mg | Freq: Once | INTRAMUSCULAR | Status: AC
  Administered 2017-12-22: 60 mg via INTRAVENOUS
  Filled 2017-12-22: qty 6

## 2017-12-22 MED ORDER — FENTANYL CITRATE (PF) 100 MCG/2ML IJ SOLN
50.0000 ug | Freq: Once | INTRAMUSCULAR | Status: AC
  Administered 2017-12-22: 50 ug via INTRAVENOUS

## 2017-12-22 MED ORDER — ENOXAPARIN SODIUM 30 MG/0.3ML ~~LOC~~ SOLN
30.0000 mg | SUBCUTANEOUS | Status: DC
  Administered 2017-12-22: 30 mg via SUBCUTANEOUS

## 2017-12-22 MED ORDER — HYDROCORTISONE NA SUCCINATE PF 100 MG IJ SOLR
100.0000 mg | Freq: Three times a day (TID) | INTRAMUSCULAR | Status: DC
  Administered 2017-12-22: 100 mg via INTRAVENOUS
  Filled 2017-12-22: qty 2

## 2017-12-22 MED ORDER — SODIUM CHLORIDE 0.9 % IV SOLN
1.0000 g | Freq: Once | INTRAVENOUS | Status: AC
  Administered 2017-12-22: 1 g via INTRAVENOUS
  Filled 2017-12-22: qty 10

## 2017-12-22 MED ORDER — MIDAZOLAM HCL 2 MG/2ML IJ SOLN
2.0000 mg | INTRAMUSCULAR | Status: DC | PRN
  Administered 2017-12-22 – 2017-12-23 (×3): 2 mg via INTRAVENOUS
  Filled 2017-12-22 (×2): qty 2

## 2017-12-22 MED ORDER — FENTANYL CITRATE (PF) 100 MCG/2ML IJ SOLN
INTRAMUSCULAR | Status: AC
  Administered 2017-12-22: 50 ug via INTRAVENOUS
  Filled 2017-12-22: qty 2

## 2017-12-22 MED ORDER — NOREPINEPHRINE BITARTRATE 1 MG/ML IV SOLN
0.0000 ug/min | INTRAVENOUS | Status: DC
  Administered 2017-12-22: 34 ug/min via INTRAVENOUS
  Administered 2017-12-23: 40 ug/min via INTRAVENOUS
  Filled 2017-12-22 (×3): qty 16

## 2017-12-22 MED ORDER — SODIUM CHLORIDE 0.9 % IV BOLUS (SEPSIS)
1000.0000 mL | INTRAVENOUS | Status: AC
Start: 2017-12-22 — End: 2017-12-22
  Administered 2017-12-22 (×2): 1000 mL via INTRAVENOUS

## 2017-12-22 MED ORDER — FENTANYL CITRATE (PF) 100 MCG/2ML IJ SOLN
25.0000 ug | INTRAMUSCULAR | Status: DC | PRN
  Administered 2017-12-22 – 2017-12-23 (×2): 50 ug via INTRAVENOUS
  Filled 2017-12-22 (×2): qty 2

## 2017-12-22 MED ORDER — SODIUM CHLORIDE 0.9 % IV SOLN
INTRAVENOUS | Status: DC
  Administered 2017-12-22: via INTRAVENOUS

## 2017-12-22 MED ORDER — MAGNESIUM SULFATE IN D5W 1-5 GM/100ML-% IV SOLN
1.0000 g | Freq: Once | INTRAVENOUS | Status: AC
  Administered 2017-12-22: 1 g via INTRAVENOUS
  Filled 2017-12-22: qty 100

## 2017-12-22 MED ORDER — MAGNESIUM SULFATE 2 GM/50ML IV SOLN
2.0000 g | Freq: Once | INTRAVENOUS | Status: AC
  Administered 2017-12-22: 2 g via INTRAVENOUS
  Filled 2017-12-22: qty 50

## 2017-12-22 MED ORDER — MIDAZOLAM HCL 2 MG/2ML IJ SOLN
INTRAMUSCULAR | Status: AC
  Administered 2017-12-22: 2 mg via INTRAVENOUS
  Filled 2017-12-22: qty 2

## 2017-12-22 MED ORDER — POTASSIUM PHOSPHATES 15 MMOLE/5ML IV SOLN
60.0000 mmol | Freq: Once | INTRAVENOUS | Status: AC
  Administered 2017-12-22: 60 mmol via INTRAVENOUS
  Filled 2017-12-22: qty 20

## 2017-12-22 NOTE — Progress Notes (Signed)
Labs sent

## 2017-12-22 NOTE — Progress Notes (Signed)
Patient ID: Tasha Rivera, female   DOB: [birth year], 84 y.o.   MRN: [id number]  [place] WUJ:[id number] DOB: December 16, 1923 DOA: 12/09/2017 PCP: Arnetha Courser, MD  HPI/Subjective: Patient brought in unresponsive.  As per critical care specialist she was 12 days at home and she stopped taking her meds.  He was positive for influenza and also in septic shock at this point.  Objective: Vitals:   12/22/17 0700 12/22/17 0800  BP: (!) 87/67 (!) 78/54  Pulse: (!) 121 (!) 119  Resp: (!) 27 (!) 24  Temp:    SpO2: 97% 97%    Filed Weights   01/02/2018 1024 12/30/2017 1223 12/22/17 0500  Weight: 59.9 kg (132 lb 0.9 oz) 54 kg (119 lb 0.8 oz) 62.4 kg (137 lb 9.1 oz)    ROS: Review of Systems  Unable to perform ROS: Critical illness   Exam: Physical Exam  HENT:  Nose: No mucosal edema.  Unable to look into mouth  Eyes:  Pupils sluggish.  Eyelid swollen.  Neck: No JVD present. Carotid bruit is not present. No edema present. No thyroid mass and no thyromegaly present.  Cardiovascular: S1 normal and S2 normal. Tachycardia present. Exam reveals no gallop.  No murmur heard. Pulses:      Dorsalis pedis pulses are 1+ on the right side, and 1+ on the left side.  Respiratory: No respiratory distress. She has decreased breath sounds in the right middle field, the right lower field, the left middle field and the left lower field. She has no wheezes. She has no rhonchi. She has no rales.  GI: Soft. Bowel sounds are normal. There is no tenderness.  Musculoskeletal:       Right ankle: She exhibits no swelling.       Left ankle: She exhibits no swelling.  Lymphadenopathy:    She has no cervical adenopathy.  Neurological:  Unresponsive to painful stimuli  Skin: Skin is warm. No rash noted. Nails show no clubbing.  Psychiatric:  Unable to assess      Data Reviewed: Basic Metabolic Panel: Recent Labs  Lab 12/09/2017 1020 12/19/2017 2153 12/22/17 1042  NA 139 143 139  K  3.1* 2.7*  2.6* 3.9  CL 113* 121* 107  CO2 13* 13* 18*  GLUCOSE 415* 369* 296*  BUN 33* 35* 35*  CREATININE 1.61* 1.88* 2.16*  CALCIUM 8.0* 7.0* 6.1*  MG  --  1.7 1.5*  PHOS  --  <1.0*  --    Liver Function Tests: Recent Labs  Lab 12/20/2017 1020  AST 26  ALT 13*  ALKPHOS 76  BILITOT 1.2  PROT 6.0*  ALBUMIN 2.0*   CBC: Recent Labs  Lab 12/10/2017 1020 12/22/17 1042  WBC 14.0* 12.6*  NEUTROABS 13.4*  --   HGB 10.6* 12.0  HCT 32.7* 36.4  MCV 90.0 87.1  PLT 275 193   Cardiac Enzymes: Recent Labs  Lab 12/22/17 1042  CKTOTAL 198    CBG: Recent Labs  Lab 12/22/17 0320 12/22/17 0433 12/22/17 0535 12/22/17 0632 12/22/17 1209  GLUCAP 244* 250* 243* 274* 248*    Recent Results (from the past 240 hour(s))  MRSA PCR Screening     Status: None   Collection Time: 12/19/2017 10:09 AM  Result Value Ref Range Status   MRSA by PCR NEGATIVE NEGATIVE Final    Comment:        The GeneXpert MRSA Assay (FDA approved for NASAL specimens only), is one component of a comprehensive  MRSA colonization surveillance program. It is not intended to diagnose MRSA infection nor to guide or monitor treatment for MRSA infections. Performed at [facility], [address] [place], [place] [address]   Culture, blood (Routine x 2)     Status: None (Preliminary result)   Collection Time: 12/25/2017 10:30 AM  Result Value Ref Range Status   Specimen Description BLOOD RT FOREARM  Final   Special Requests   Final    BOTTLES DRAWN AEROBIC AND ANAEROBIC Blood Culture adequate volume   Culture   Final    NO GROWTH < 24 HOURS Performed at [facility], [address] [place], [place] [address]    Report Status PENDING  Incomplete  Culture, blood (Routine x 2)     Status: None (Preliminary result)   Collection Time: 12/13/2017 10:49 AM  Result Value Ref Range Status   Specimen Description BLOOD LT [facility]  Final   Special Requests   Final    BOTTLES DRAWN AEROBIC AND  ANAEROBIC Blood Culture adequate volume   Culture   Final    NO GROWTH < 24 HOURS Performed at [facility], [address] [place], [place] [address]    Report Status PENDING  Incomplete  Culture, respiratory (NON-Expectorated)     Status: None (Preliminary result)   Collection Time: 12/19/2017 12:28 PM  Result Value Ref Range Status   Specimen Description   Final    TRACHEAL ASPIRATE Performed at [facility], [address], [place], [place] [address]    Special Requests   Final    NONE Performed at [facility], [address] [place], [place] [address]    Gram Stain   Final    ABUNDANT WBC PRESENT, PREDOMINANTLY PMN RARE SQUAMOUS EPITHELIAL CELLS PRESENT ABUNDANT GRAM POSITIVE COCCI IN CLUSTERS FEW GRAM NEGATIVE COCCI IN PAIRS    Culture   Final    CULTURE REINCUBATED FOR BETTER GROWTH Performed at [facility], [place] [address] [place],  [address]    Report Status PENDING  Incomplete     Studies: Dg Abdomen 1 View  Result Date: 01/03/2018 CLINICAL DATA:  OG tube placement EXAM: ABDOMEN - 1 VIEW COMPARISON:  10/31/2016 FINDINGS: Enteric tube tip is in the distal stomach. Nonobstructive bowel gas pattern. IMPRESSION: Enteric tube tip in the distal stomach. Electronically Signed   By: Rolm Baptise M.D.   On: 01/02/2018 10:52   Ct Head Wo Contrast  Result Date: 12/27/2017 CLINICAL DATA:  Altered level of consciousness. Patient found unresponsive at home. Patient is intubated. EXAM: CT HEAD WITHOUT CONTRAST TECHNIQUE: Contiguous axial images were obtained from the base of the skull through the vertex without intravenous contrast. COMPARISON:  None. FINDINGS: Brain: Mild atrophy and white matter changes are present bilaterally. Basal ganglia are intact. Insular ribbon is normal. No acute infarct, hemorrhage, or mass lesion is present. The brainstem and cerebellum are normal. Vascular: Vascular calcifications are present within the  cavernous internal carotid arteries and at the dural margin of the left vertebral artery. There is no hyperdense vessel. Skull: Calvarium is intact. No focal lytic or blastic lesions are present. Extracranial soft tissues are within normal limits. Sinuses/Orbits: A fluid level is present in the right sphenoid sinus. The remaining paranasal sinuses and the mastoid air cells are clear. IMPRESSION: 1. Mild atrophy and white matter disease. This likely reflects the sequela of chronic microvascular ischemia. 2. No acute intracranial abnormality. 3. Right sphenoid sinus disease. Electronically Signed  By: San Morelle M.D.   On: 12/09/2017 11:28   Dg Chest Port 1 View  Result Date: 12/08/2017 CLINICAL DATA:  Central line placement. EXAM: PORTABLE CHEST 1 VIEW COMPARISON:  1011 hours on the same day. FINDINGS: New right IJ central line catheter is seen in the mid SVC without associated pneumothorax. Endotracheal tube tip is seen at the level of the aortic arch approximately 2 cm above the carina. Pneumonic consolidations are redemonstrated in the right upper lobe, left mid and lower lung, slightly more confluent in the right upper lobe since earlier exam. Gastric tube extends into the expected location the stomach. Tip is excluded on this study. IMPRESSION: 1. Patchy pulmonary consolidations slightly more confluent in the right upper lobe since prior with stable airspace opacities in the left mid and lower lung. 2. New right IJ central line catheter is seen with tip in the mid SVC level. No pneumothorax. 3. Endotracheal tube tip is now 2 cm above the carina previously having been at the carina. 4. Gastric tube extends below left hemidiaphragm. Electronically Signed   By: Ashley Royalty M.D.   On: 12/12/2017 18:11   Dg Chest Portable 1 View  Result Date: 01/01/2018 CLINICAL DATA:  Hypoxia EXAM: PORTABLE CHEST 1 VIEW COMPARISON:  October 31, 2016 FINDINGS: Endotracheal tube tip is at the carina.  No  pneumothorax. There is airspace consolidation in the right upper lobe as well as in portions of the lingula and left lower lobe. Lungs elsewhere clear. Heart size and pulmonary vascularity are normal. There is aortic atherosclerosis. No adenopathy evident. No evident bone lesions. IMPRESSION: Endotracheal tube tip is at the carina. Advise withdrawing endotracheal tube approximately 3 cm. No pneumothorax. There is airspace consolidation in portions of the right upper lobe, lingula, and left lower lobe, consistent with multifocal pneumonia. Heart size normal. No adenopathy evident. There is aortic atherosclerosis. Aortic Atherosclerosis (ICD10-I70.0). These results were called by telephone at the time of interpretation on 12/24/2017 at 10:50 am to Dr. Larae Grooms , who verbally acknowledged these results. Electronically Signed   By: Lowella Grip III M.D.   On: 12/20/2017 10:51   Dg Abd Portable 1v  Result Date: 12/20/2017 CLINICAL DATA:  Encounter for OG tube placement EXAM: PORTABLE ABDOMEN - 1 VIEW COMPARISON:  1025 hours on the same day FINDINGS: Study targeted towards assessment of the gastric tube. The tip of a gastric tube is seen in the right hemiabdomen along the expected location of the distal stomach or proximal duodenum. Bowel gas pattern is unremarkable. No free air is noted. Atelectasis and/or patchy airspace opacities are noted of the left mid and lower lung. Thoracolumbar spondylosis. IMPRESSION: 1. The tip of a gastric tube is seen in the right hemiabdomen along the expected course of the distal stomach or proximal duodenum. 2. Patchy airspace opacities and atelectasis involving the left mid and lower lung. Question pneumonia. Electronically Signed   By: Ashley Royalty M.D.   On: 12/24/2017 18:09    Scheduled Meds: . chlorhexidine gluconate (MEDLINE KIT)  15 mL Mouth Rinse BID  . enoxaparin (LOVENOX) injection  30 mg Subcutaneous Q24H  . ipratropium-albuterol  3 mL Nebulization Q6H  .  mouth rinse  15 mL Mouth Rinse QID  . oseltamivir  45 mg Oral Daily  . pantoprazole (PROTONIX) IV  40 mg Intravenous Q24H   Continuous Infusions: . sodium chloride    . calcium gluconate    . ceFEPime (MAXIPIME) IV Stopped (12/22/17 0146)  . insulin (NOVOLIN-R)  infusion Stopped (12/22/17 0743)  . magnesium sulfate 1 - 4 g bolus IVPB    . norepinephrine (LEVOPHED) Adult infusion 30 mcg/min (12/22/17 1140)  .  sodium bicarbonate (isotonic) infusion in sterile water 150 mL/hr at 12/22/17 9371  . [START ON 02-Jan-2018] vancomycin    . vasopressin (PITRESSIN) infusion - *FOR SHOCK*      Assessment/Plan:  1. Septic shock with lactic acidosis.  Continue pressors to maintain blood pressure.  Continue bicarb. 2. Acute hypoxic respiratory failure secondary to influenza complicated with multi lobar pneumonia.  Patient on 100% oxygen.  Continue antibiotics for pneumonia and oseltamivir for influenza. 3. Acute kidney injury on sodium bicarb 4. Electrolyte abnormalities including hypophosphatemia, hypomagnesemia, hypokalemia and hypocalcemia.  Patient on electrolyte protocol in the ICU. 5. Acute encephalopathy secondary to septic shock 6. Type 2 diabetes uncontrolled.  Initially was on insulin drip.  Now will have to go over to insulin sliding scale. 7. History of hypertension. 8. History of hyperlipidemia 9. History of ovarian cancer  Code Status:     Code Status Orders  (From admission, onward)        Start     Ordered   12/28/2017 1730  Do not attempt resuscitation (DNR)  Continuous    Question Answer Comment  In the event of cardiac or respiratory ARREST Do not call a "code blue"   In the event of cardiac or respiratory ARREST Do not perform Intubation, CPR, defibrillation or ACLS   In the event of cardiac or respiratory ARREST Use medication by any route, position, wound care, and other measures to relive pain and suffering. May use oxygen, suction and manual treatment of airway  obstruction as needed for comfort.      12/09/2017 1729    Code Status History    Date Active Date Inactive Code Status Order ID Comments User Context   12/22/2017 13:39 12/07/2017 17:29 Full Code [id number]  Lafayette Dragon, MD Inpatient   12/31/2017 12:32 12/14/2017 13:39 Full Code [id number]  Nicholes Mango, MD Inpatient   08/13/2017 15:45 12/14/2017 10:08 DNR [id number] Discussion with interpreter August 13, 2017 Arnetha Courser, MD Outpatient     Family Communication: As per critical care specialist.  They are waiting for son to come in from [place] in a few days before making further decisions. Disposition Plan: Patient is critically ill and likely will not survive the hospital course  Consultants:  Critical care specialist  Time spent: 25 minutes  [place]

## 2017-12-22 NOTE — Progress Notes (Addendum)
2130 CA 5.9, lactic 8.7.  Notified Luci Bankukov, NP.  Orders received.  Started NS bolus

## 2017-12-22 NOTE — Progress Notes (Signed)
   12/22/17 2000  Vitals  Pulse Rate (!) 133  ECG Heart Rate (!) 136   PT HR 130 when stimulated.  Pt given 12.5mg  Fentanyl IVP x1 for elevated HR.

## 2017-12-22 NOTE — Progress Notes (Signed)
Notified TUkov, NP of non-sustained run of SVT in 160s.  Noted by Telemetry monitoring.

## 2017-12-22 NOTE — Progress Notes (Signed)
   12/22/17 2015  Vitals  Pulse Rate (!) 130  Pulse Rate Source Monitor  ECG Heart Rate (!) 130   Fentanyl IVP not successful in lowering HR.  Attempting to notify provider.

## 2017-12-22 NOTE — Progress Notes (Signed)
Per radiology - notified Luci Bankukov, NP that KUB and PCXR coimpleted

## 2017-12-22 NOTE — Progress Notes (Signed)
Urine output this shift was 30 mL. Bladder scan showed 699 mL. Per Dr. Peggye Pittichards foley was exchanged, no sediment found upon removal. New catheter placed with no urine returned. Dr. Peggye Pittichards aware.

## 2017-12-22 NOTE — Progress Notes (Signed)
PULMONARY / CRITICAL CARE MEDICINE   Name: Tasha Rivera MRN: [name] DOB: 10/03/1924    ADMISSION DATE:  August 08, 2018   HISTORY OF PRESENT ILLNESS:   Tasha Rivera a89 y.o.femalewith a known history of hypertension, diabetes mellitus was brought into the emergency department via EMS as patient was unresponsive. Patient was nonverbal. 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 Patient had flulike symptoms for the past 2 weeks and was not seen by any doctors. According to the family members patient became altered for 3 hours prior to EMS arrival. In the emergency department patient was found to be febrile, hypotensive, tachycardic with leukocytosis. She was hypoxemic andwas intubated. Patient has received 3 L of IV fluid boluses in the emergency department and IV antibiotics were started   REVIEW OF SYSTEMS:   unobtainable  SUBJECTIVE:  Patient remains critically ill on vent support and on vasopressor support,.  VITAL SIGNS: BP (!) 78/54   Pulse (!) 119   Temp 98.1 F (36.7 C) (Bladder)   Resp (!) 24   Ht 5' (1.524 m)   Wt 137 lb 9.1 oz (62.4 kg)   SpO2 97%   BMI 26.87 kg/m   HEMODYNAMICS: CVP:  [3 mmHg-8 mmHg] 4 mmHg  VENTILATOR SETTINGS: Vent Mode: PRVC FiO2 (%):  [50 %-100 %] 100 % Set Rate:  [15 bmp-20 bmp] 20 bmp Vt Set:  [450 mL] 450 mL PEEP:  [5 cmH20-8 cmH20] 8 cmH20 Plateau Pressure:  [22 cmH20] 22 cmH20  INTAKE / OUTPUT: I/O last 3 completed shifts: In: 9618.7 [I.V.:3548.7; Other:3000; IV Piggyback:3070] Out: 450 [Urine:450]  PHYSICAL EXAMINATION: General:  No distress Neuro:  Moves left upper and lower extremities spontaneously, no movement on the right side,Frowns appropriately but no commands HEENT:  Pupils asymmetrical, right pupil elliptical, ET tube in place Cardiovascular:  Sinus tachycardia Lungs:  Bilateral rhonchi Abdomen:  Soft, inaudible  bowel sounds Musculoskeletal:  No deformities Skin:  Mottled at knees, cold and dry; doppler DP pulses  LABS:  BMET Recent Labs  Lab 2018-01-22 1020 2018-01-22 2153  NA 139 143  K 3.1* 2.7*  2.6*  CL 113* 121*  CO2 13* 13*  BUN 33* 35*  CREATININE 1.61* 1.88*  GLUCOSE 415* 369*    Electrolytes Recent Labs  Lab 2018-01-22 1020 2018-01-22 2153  CALCIUM 8.0* 7.0*  MG  --  1.7  PHOS  --  <1.0*    CBC Recent Labs  Lab 2018-01-22 1020  WBC 14.0*  HGB 10.6*  HCT 32.7*  PLT 275    Coag's Recent Labs  Lab 2018-01-22 1020  INR 1.36    Sepsis Markers Recent Labs  Lab 2018-01-22 1020 2018-01-22 1239 2018-01-22 2153  LATICACIDVEN 2.9* 2.7* 4.7*    ABG Recent Labs  Lab 2018-01-22 1733 2018-01-22 2314 12/22/17 0500  PHART 7.05* 7.02* 7.22*  PCO2ART 41 42 35  PO2ART 52* 59* 46*    Liver Enzymes Recent Labs  Lab 2018-01-22 1020  AST 26  ALT 13*  ALKPHOS 76  BILITOT 1.2  ALBUMIN 2.0*    Cardiac Enzymes No results for input(s): TROPONINI, PROBNP in the last 168 hours.  Glucose Recent Labs  Lab 12/22/17 0119 12/22/17 0218 12/22/17 0320 12/22/17 0433 12/22/17 0535 12/22/17 0632  GLUCAP 262* 223* 244* 250* 243* 274*    Imaging Dg Abdomen 1 View  Result Date: August 08, 2018 CLINICAL DATA:  OG tube placement EXAM: ABDOMEN - 1 VIEW COMPARISON:  10/31/2016  FINDINGS: Enteric tube tip is in the distal stomach. Nonobstructive bowel gas pattern. IMPRESSION: Enteric tube tip in the distal stomach. Electronically Signed   By: Charlett Nose M.D.   On: 12/25/2017 10:52   Ct Head Wo Contrast  Result Date: 12/30/2017 CLINICAL DATA:  Altered level of consciousness. Patient found unresponsive at home. Patient is intubated. EXAM: CT HEAD WITHOUT CONTRAST TECHNIQUE: Contiguous axial images were obtained from the base of the skull through the vertex without intravenous contrast. COMPARISON:  None. FINDINGS: Brain: Mild atrophy and white matter changes are present bilaterally. Basal  ganglia are intact. Insular ribbon is normal. No acute infarct, hemorrhage, or mass lesion is present. The brainstem and cerebellum are normal. Vascular: Vascular calcifications are present within the cavernous internal carotid arteries and at the dural margin of the left vertebral artery. There is no hyperdense vessel. Skull: Calvarium is intact. No focal lytic or blastic lesions are present. Extracranial soft tissues are within normal limits. Sinuses/Orbits: A fluid level is present in the right sphenoid sinus. The remaining paranasal sinuses and the mastoid air cells are clear. IMPRESSION: 1. Mild atrophy and white matter disease. This likely reflects the sequela of chronic microvascular ischemia. 2. No acute intracranial abnormality. 3. Right sphenoid sinus disease. Electronically Signed   By: Marin Roberts M.D.   On: 12/12/2017 11:28   Dg Chest Port 1 View  Result Date: 12/12/2017 CLINICAL DATA:  Central line placement. EXAM: PORTABLE CHEST 1 VIEW COMPARISON:  1011 hours on the same day. FINDINGS: New right IJ central line catheter is seen in the mid SVC without associated pneumothorax. Endotracheal tube tip is seen at the level of the aortic arch approximately 2 cm above the carina. Pneumonic consolidations are redemonstrated in the right upper lobe, left mid and lower lung, slightly more confluent in the right upper lobe since earlier exam. Gastric tube extends into the expected location the stomach. Tip is excluded on this study. IMPRESSION: 1. Patchy pulmonary consolidations slightly more confluent in the right upper lobe since prior with stable airspace opacities in the left mid and lower lung. 2. New right IJ central line catheter is seen with tip in the mid SVC level. No pneumothorax. 3. Endotracheal tube tip is now 2 cm above the carina previously having been at the carina. 4. Gastric tube extends below left hemidiaphragm. Electronically Signed   By: Tollie Eth M.D.   On: 01/03/2018 18:11    Dg Chest Portable 1 View  Result Date: 12/22/2017 CLINICAL DATA:  Hypoxia EXAM: PORTABLE CHEST 1 VIEW COMPARISON:  October 31, 2016 FINDINGS: Endotracheal tube tip is at the carina.  No pneumothorax. There is airspace consolidation in the right upper lobe as well as in portions of the lingula and left lower lobe. Lungs elsewhere clear. Heart size and pulmonary vascularity are normal. There is aortic atherosclerosis. No adenopathy evident. No evident bone lesions. IMPRESSION: Endotracheal tube tip is at the carina. Advise withdrawing endotracheal tube approximately 3 cm. No pneumothorax. There is airspace consolidation in portions of the right upper lobe, lingula, and left lower lobe, consistent with multifocal pneumonia. Heart size normal. No adenopathy evident. There is aortic atherosclerosis. Aortic Atherosclerosis (ICD10-I70.0). These results were called by telephone at the time of interpretation on 12/25/2017 at 10:50 am to Dr. Gladstone Pih , who verbally acknowledged these results. Electronically Signed   By: Bretta Bang III M.D.   On: 12/26/2017 10:51   Dg Abd Portable 1v  Result Date: 12/22/2017 CLINICAL DATA:  Encounter for  OG tube placement EXAM: PORTABLE ABDOMEN - 1 VIEW COMPARISON:  1025 hours on the same day FINDINGS: Study targeted towards assessment of the gastric tube. The tip of a gastric tube is seen in the right hemiabdomen along the expected location of the distal stomach or proximal duodenum. Bowel gas pattern is unremarkable. No free air is noted. Atelectasis and/or patchy airspace opacities are noted of the left mid and lower lung. Thoracolumbar spondylosis. IMPRESSION: 1. The tip of a gastric tube is seen in the right hemiabdomen along the expected course of the distal stomach or proximal duodenum. 2. Patchy airspace opacities and atelectasis involving the left mid and lower lung. Question pneumonia. Electronically Signed   By: Tollie Eth M.D.   On: 12-30-2017 18:09      STUDIES:  2/15- CT head- negative for intracranial finding  CULTURES: 2/15- Influenza positive  ANTIBIOTICS: 2/15- Cefepime, Vancomycin, Tamiflu  SIGNIFICANT EVENTS: none  LINES/TUBES: 2/15- intubated, right IJ TLC   DISCUSSION: This 84 year old lady was admitted because of unresponsiveness, She was found to be hypoxic and in septic shock.  She is on vent support and vasopressor support. She and her family speak [place] and need translator.   ASSESSMENT / PLAN:  1. Severe sepsis with shock on vasopressor support 2.  Acute hypoxic Respiratory failure secondary to Influenza Pneumonia and multi-lobar Staph Aureus Pneumonia with ARDS 3.  Acute Kidney Injury with oliguria 4. Electrolytes abnormalities with hypokalemia, hypophosphatemia, hypomagnesemia 5. Hx DM with hyperglycemia  Plans 1. Continue IVF and Vasopressor support,  Will check mixed venous saturation and consider Dobutamine for better perfusion, CVP monitoring 2. Continue lung protective strategy ventilation with PEEP recruitment, continue ABX 3. Electrolytes replacement then restart insulin drip for glycemic control 4. Will follow up lactic acid, blood cultures and sputum cultures 5. GI and DVT prophylaxis    FAMILY  - Updates: given to sister via family interpreter  I have dedicated a total of 45 minutes in critical care time minus all appropriate exclusions.   Jackson Latino, M.D Pulmonary and Critical Care Medicine [facility] Pager: [phone]  12/22/2017, 9:15 AM

## 2017-12-22 NOTE — Progress Notes (Signed)
   12/22/17 2320  Vitals  BP (!) 84/58  MAP (mmHg) 66  BP Location Left Arm  BP Method Automatic  Patient Position (if appropriate) Lying  Pulse Rate (!) 134  Pulse Rate Source Monitor  ECG Heart Rate (!) 134  Cardiac Rhythm Atrial fibrillation  Resp (!) 37  Oxygen Therapy  SpO2 96 %  O2 Device Ventilator  FiO2 (%) 90 %  Pulse Oximetry Type Continuous  End Tidal CO2 (EtCO2) 12   Pt in a-fib from ST.  Notified Luci Bankukov., NP.

## 2017-12-22 NOTE — Care Management Note (Addendum)
Case Management Note  Patient Details  Name: Tasha Rivera MRN: [id number] Date of Birth: [birth year]  Subjective/Objective:      Per Jerilynn Somalvin at [facility], [name], Aide.               Action/Plan:   Expected Discharge Date:                  Expected Discharge Plan:     In-House Referral:     Discharge planning Services     Post Acute Care Choice:    Choice offered to:     DME Arranged:    DME Agency:     HH Arranged:    HH Agency:     Status of Service:     If discussed at [employer] Length of Stay Meetings, dates discussed:    Additional Comments:  Pierce Barocio A, RN 12/22/2017, 10:41 AM

## 2017-12-22 NOTE — Progress Notes (Signed)
Provider Luci Bankukov, NP at bedside.

## 2017-12-22 NOTE — Clinical Social Work Note (Signed)
CSW received consult for SNF placement. PT eval is pending. CSW will follow pending PT recommendation.  Argentina PonderKaren Martha Derral Colucci, MSW, Theresia MajorsLCSWA [phone]

## 2017-12-22 NOTE — Progress Notes (Signed)
   12/22/17 2116  Vitals  BP (!) 77/49  MAP (mmHg) (!) 58  Pulse Rate (!) 126  ECG Heart Rate (!) 127  Resp (!) 38  Oxygen Therapy  SpO2 (!) 88 %   Sats as low as 84%.  ABG completed.  BP low.  Spoke with Luci Bankukov, NP.  Increase levo prn to 3165mcg/min.

## 2017-12-22 NOTE — Progress Notes (Signed)
Hold insuling until potassium above 3. Insuling held at this time.

## 2017-12-22 NOTE — Progress Notes (Signed)
PCXR completed.

## 2017-12-23 LAB — BASIC METABOLIC PANEL
ANION GAP: 11 (ref 5–15)
BUN: 36 mg/dL — AB (ref 6–20)
CHLORIDE: 106 mmol/L (ref 101–111)
CO2: 22 mmol/L (ref 22–32)
Calcium: 6.3 mg/dL — CL (ref 8.9–10.3)
Creatinine, Ser: 2 mg/dL — ABNORMAL HIGH (ref 0.44–1.00)
GFR calc non Af Amer: 21 mL/min — ABNORMAL LOW (ref >60)
GFR, EST AFRICAN AMERICAN: 24 mL/min — AB (ref >60)
Glucose, Bld: 114 mg/dL — ABNORMAL HIGH (ref 65–99)
POTASSIUM: 3.5 mmol/L (ref 3.5–5.1)
SODIUM: 139 mmol/L (ref 135–145)

## 2017-12-23 LAB — TROPONIN I: TROPONIN I: 0.2 ng/mL — AB (ref <0.03)

## 2017-12-23 LAB — LEGIONELLA PNEUMOPHILA SEROGP 1 UR AG: L. PNEUMOPHILA SEROGP 1 UR AG: NEGATIVE

## 2017-12-23 LAB — MAGNESIUM: MAGNESIUM: 1.9 mg/dL (ref 1.7–2.4)

## 2017-12-23 LAB — GLUCOSE, CAPILLARY
GLUCOSE-CAPILLARY: 113 mg/dL — AB (ref 65–99)
Glucose-Capillary: 88 mg/dL (ref 65–99)

## 2017-12-23 MED ORDER — SODIUM CHLORIDE 0.9 % IV SOLN
10.0000 mg/h | INTRAVENOUS | Status: DC
  Administered 2017-12-23: 15 mg/h via INTRAVENOUS
  Filled 2017-12-23: qty 10

## 2017-12-23 MED ORDER — MORPHINE BOLUS VIA INFUSION
5.0000 mg | INTRAVENOUS | Status: DC | PRN
  Filled 2017-12-23: qty 20

## 2017-12-24 LAB — CULTURE, RESPIRATORY

## 2017-12-24 LAB — CULTURE, RESPIRATORY W GRAM STAIN

## 2017-12-26 ENCOUNTER — Ambulatory Visit: Payer: Self-pay | Admitting: Family Medicine

## 2017-12-26 LAB — CULTURE, BLOOD (ROUTINE X 2)
Culture: NO GROWTH
Culture: NO GROWTH
SPECIAL REQUESTS: ADEQUATE
Special Requests: ADEQUATE

## 2017-12-27 ENCOUNTER — Ambulatory Visit: Payer: Self-pay | Admitting: Family Medicine

## 2018-01-01 ENCOUNTER — Telehealth: Payer: Self-pay

## 2018-01-01 NOTE — Telephone Encounter (Signed)
Recieved Death Certificate from [employer] Delivered/Placed __[facility]es [employer]

## 2018-01-01 NOTE — Telephone Encounter (Signed)
Death cert placed in DK's folder to be signed. 

## 2018-01-01 NOTE — Telephone Encounter (Signed)
[employer] notified death certificate ready for p/u.

## 2018-01-04 NOTE — Progress Notes (Signed)
Family and Luci Bankukov, NP at bedside.

## 2018-01-04 NOTE — Progress Notes (Signed)
Morphine gtt initiated at 15mg /hr.  Plan to extubate at 0315.

## 2018-01-04 NOTE — Progress Notes (Signed)
Pt extubated at 03:15 by RT

## 2018-01-04 NOTE — Progress Notes (Signed)
S: Patient remains acutely ill with worsening lab indices: lactic acid trending up, persistent acidosis with increasing O2 requirements, refractory shock now requiring maximum doses of vasopressin and norepinephrine and persistent anuric renal failure. Worsening opacities on CXR and possible bowel obstruction on abdominal film.  Of note patient had an adnexa mass and was followed by  GYN-oncology and had declined any surgical intervention or further work-up. As of last CT, mass was increasing in size.   O: Vitals:   12/11/17 0020 12/11/17 0030  BP: (!) 96/52 (!) 100/52  Pulse:    Resp: (!) 29 (!) 27  Temp:    SpO2:  95%    sedated with minimal reflexes, bibasilar rales and rhonchi, Afib with rates in the 130s to 140s, marginal MAPs in the 60s on 2 pressors, pale with mildly cyanotic extremities, cold and clammy, temp 95.5  Assessment and plan Patient's condition continues to deteriorate despite interventions. STAT labs and imaging reviewed and discussed with Dr. Peggye Pittichards. Bicarb infusion discontinued and NS boluses given. Electrolytes corrected and vent settings changed. SPO2 much stable but still requiring high FiO2. HR gradually trending up with rhythm change to Afib. Clearly patient does not stand to benefit from any anticoagulation or antiarrhythmics as her arrhythmias are probably due to medication side effects and ellectrolyte derangements. Awaiting repeat labs.   Patient's son and grandson notified that patient may not make it till morning. All questions answered.  Will continue to monitor  Magdalene S. [facility] ANP-BC Pulmonary and Critical Care Medicine [facility] Pager [phone] or [phone]  NB: This document was prepared using Dragon voice recognition software and may include unintentional dictation errors.

## 2018-01-04 NOTE — Progress Notes (Signed)
   12/24/2017 0300  Clinical Encounter Type  Visited With Patient and family together  Visit Type Follow-up  Referral From Nurse (nurse practitioner)  Consult/Referral To Chaplain   Chaplain responded to order regarding end of life.  Chaplain engaged staff and learned that patient was to be extubated; family and pastor present.  Chaplain introduced self and offered support.  Family reported that their pastor was with them for support.  Chaplain encouraged family to reach out to chaplain if needs changed; chaplain reported engagement to staff.

## 2018-01-04 NOTE — Death Summary Note (Signed)
DEATH SUMMARY   Patient Details  Name: Tasha Rivera MRN: [id number] DOB: 1924/02/06  Admission/Discharge Information   Admit Date:  12/30/2017  Date of Death: Date of Death: 01/01/18  Time of Death: Time of Death: 0324  Length of Stay: 2  Referring Physician: Kerman Passey, MD   Reason(s) for Hospitalization  Acute hypoxic respiratory failure Oliguric renal failure Refractory septic shock Severe sepsis Electrolyte abnormalities Severe lactic acidosis Metabolic acidosis Diagnoses  Preliminary cause of death: Septic shock with multiple organ failure Secondary Diagnoses (including complications and co-morbidities):  Active Problems:   Acute respiratory failure with hypoxia (HCC)   Acute respiratory failure (HCC) Oliguric renal failure Refractory septic shock Severe sepsis Electrolyte abnormalities Severe lactic acidosis Metabolic acidosis  Brief Hospital Course (including significant findings, care, treatment, and services provided and events leading to death)  Chrishana Spargur is a 84 y.o. year old female who admitted because of unresponsiveness, She was found to be in hypoxic respiratory failure, severe sepsis and in septic shock, influenza  A pneumonia. She was intubated and admitted to the ICU. She was maintained on full vent support, pressors, bicarb infusion, tamiflu and broad spectrum antibiotics. She proceeded to develop oliguric renal failure, ARDS and refractory shock. Despite interventions, patient continued to decline.Thsi evening she had a sudden increase in her lactic acid level. An abdominal x-ray suggested a possible bowel obstruction. Despite interventions, patient continued to decline with a sudden increase in pressor requirement, Fi02 and HR in the 140-170s. Family was notified and they all came to the bedside. After reviewing all the current diagnosis and treatment plan with the family, they felt that this was not what the patient would have wanted her end of life care to  be and decided to proceed with full comfort care and a one way extubation. Withdrawal of care orders placed and patient was uneventfully extubated at 03:15 and expired at 0324. Support provided top family at bedside  Pertinent Labs and Studies  Significant Diagnostic Studies Dg Abd 1 View  Result Date: 12/22/2017 CLINICAL DATA:  Lactic acid acidosis. EXAM: ABDOMEN - 1 VIEW COMPARISON:  Plain films of the abdomen dated 12-30-17. FINDINGS: Enteric tube appears appropriately positioned in the stomach. Prominent gas-filled loop of bowel is seen within the central abdomen, uncertain if small or large bowel. No evidence of free intraperitoneal air or pneumatosis intestinalis seen. No evidence of soft tissue mass or abnormal fluid collection. IMPRESSION: 1. Prominent patulous gas-filled loop of bowel within the central abdomen, measuring approximately 7 cm diameter, uncertain if small or large bowel, favor large bowel. A developing bowel obstruction cannot be excluded. Recommend CT abdomen and pelvis for further characterization. 2. Enteric tube appears appropriately positioned in the stomach. These results will be called to the ordering clinician or representative by the Radiologist Assistant, and communication documented in the PACS or zVision Dashboard. Electronically Signed   By: Bary Richard M.D.   On: 12/22/2017 22:25   Dg Abdomen 1 View  Result Date: 12-30-2017 CLINICAL DATA:  OG tube placement EXAM: ABDOMEN - 1 VIEW COMPARISON:  10/31/2016 FINDINGS: Enteric tube tip is in the distal stomach. Nonobstructive bowel gas pattern. IMPRESSION: Enteric tube tip in the distal stomach. Electronically Signed   By: Charlett Nose M.D.   On: Dec 30, 2017 10:52   Ct Head Wo Contrast  Result Date: 30-Dec-2017 CLINICAL DATA:  Altered level of consciousness. Patient found unresponsive at home. Patient is intubated. EXAM: CT HEAD WITHOUT CONTRAST TECHNIQUE: Contiguous axial images were obtained from the  base of the skull  through the vertex without intravenous contrast. COMPARISON:  None. FINDINGS: Brain: Mild atrophy and white matter changes are present bilaterally. Basal ganglia are intact. Insular ribbon is normal. No acute infarct, hemorrhage, or mass lesion is present. The brainstem and cerebellum are normal. Vascular: Vascular calcifications are present within the cavernous internal carotid arteries and at the dural margin of the left vertebral artery. There is no hyperdense vessel. Skull: Calvarium is intact. No focal lytic or blastic lesions are present. Extracranial soft tissues are within normal limits. Sinuses/Orbits: A fluid level is present in the right sphenoid sinus. The remaining paranasal sinuses and the mastoid air cells are clear. IMPRESSION: 1. Mild atrophy and white matter disease. This likely reflects the sequela of chronic microvascular ischemia. 2. No acute intracranial abnormality. 3. Right sphenoid sinus disease. Electronically Signed   By: Marin Roberts M.D.   On: 01/02/2018 11:28   Dg Chest Port 1 View  Result Date: 12/22/2017 CLINICAL DATA:  Acute respiratory failure EXAM: PORTABLE CHEST 1 VIEW COMPARISON:  Chest x-rays dated 01/02/18. FINDINGS: Endotracheal tube tip is at the level of the carina. Right IJ central line appears stable in position with tip at the level of the upper SVC. Enteric tube passes below the diaphragm. Persistent opacity at the right lung apex, compatible with residual airspace collapse related to earlier endobronchial intubation (02-Jan-2018). There are increasing opacities at the lung bases, atelectasis versus asymmetric pulmonary edema. Probable small bilateral pleural effusions. No pneumothorax seen. Heart size and mediastinal contours are stable. Atherosclerotic changes again noted at the aortic arch. IMPRESSION: 1. Endotracheal tube tip is now at the level of the carina. Recommend retracting 1-2 cm for optimal radiographic appearance. 2. Persistent opacity at the  right lung apex, consistent with residual airspace collapse related to the previous endobronchial intubation, perhaps slightly improved. 3. Increasing bibasilar airspace opacities, most likely atelectasis or pulmonary edema. Aspiration is also a possibility. 4. Probable small bilateral pleural effusions. 5. Stable cardiomegaly. 6. Aortic atherosclerosis. These results will be called to the ordering clinician or representative by the Radiologist Assistant, and communication documented in the PACS or zVision Dashboard. Electronically Signed   By: Bary Richard M.D.   On: 12/22/2017 22:20   Dg Chest Port 1 View  Result Date: 01-02-2018 CLINICAL DATA:  Central line placement. EXAM: PORTABLE CHEST 1 VIEW COMPARISON:  1011 hours on the same day. FINDINGS: New right IJ central line catheter is seen in the mid SVC without associated pneumothorax. Endotracheal tube tip is seen at the level of the aortic arch approximately 2 cm above the carina. Pneumonic consolidations are redemonstrated in the right upper lobe, left mid and lower lung, slightly more confluent in the right upper lobe since earlier exam. Gastric tube extends into the expected location the stomach. Tip is excluded on this study. IMPRESSION: 1. Patchy pulmonary consolidations slightly more confluent in the right upper lobe since prior with stable airspace opacities in the left mid and lower lung. 2. New right IJ central line catheter is seen with tip in the mid SVC level. No pneumothorax. 3. Endotracheal tube tip is now 2 cm above the carina previously having been at the carina. 4. Gastric tube extends below left hemidiaphragm. Electronically Signed   By: Tollie Eth M.D.   On: 2018/01/02 18:11   Dg Chest Portable 1 View  Result Date: 01-02-18 CLINICAL DATA:  Hypoxia EXAM: PORTABLE CHEST 1 VIEW COMPARISON:  October 31, 2016 FINDINGS: Endotracheal tube tip is at the  carina.  No pneumothorax. There is airspace consolidation in the right upper lobe as  well as in portions of the lingula and left lower lobe. Lungs elsewhere clear. Heart size and pulmonary vascularity are normal. There is aortic atherosclerosis. No adenopathy evident. No evident bone lesions. IMPRESSION: Endotracheal tube tip is at the carina. Advise withdrawing endotracheal tube approximately 3 cm. No pneumothorax. There is airspace consolidation in portions of the right upper lobe, lingula, and left lower lobe, consistent with multifocal pneumonia. Heart size normal. No adenopathy evident. There is aortic atherosclerosis. Aortic Atherosclerosis (ICD10-I70.0). These results were called by telephone at the time of interpretation on 01-Nov-2018 at 10:50 am to Dr. Gladstone PihAVID SCHAEVITZ , who verbally acknowledged these results. Electronically Signed   By: Bretta BangWilliam  Woodruff III M.D.   On: 027-Dec-2019 10:51   Dg Abd Portable 1v  Result Date: 01-Nov-2018 CLINICAL DATA:  Encounter for OG tube placement EXAM: PORTABLE ABDOMEN - 1 VIEW COMPARISON:  1025 hours on the same day FINDINGS: Study targeted towards assessment of the gastric tube. The tip of a gastric tube is seen in the right hemiabdomen along the expected location of the distal stomach or proximal duodenum. Bowel gas pattern is unremarkable. No free air is noted. Atelectasis and/or patchy airspace opacities are noted of the left mid and lower lung. Thoracolumbar spondylosis. IMPRESSION: 1. The tip of a gastric tube is seen in the right hemiabdomen along the expected course of the distal stomach or proximal duodenum. 2. Patchy airspace opacities and atelectasis involving the left mid and lower lung. Question pneumonia. Electronically Signed   By: Tollie Ethavid  Kwon M.D.   On: 027-Dec-2019 18:09    Microbiology Recent Results (from the past 240 hour(s))  MRSA PCR Screening     Status: None   Collection Time: 08/27/2018 10:09 AM  Result Value Ref Range Status   MRSA by PCR NEGATIVE NEGATIVE Final    Comment:        The GeneXpert MRSA Assay (FDA approved  for NASAL specimens only), is one component of a comprehensive MRSA colonization surveillance program. It is not intended to diagnose MRSA infection nor to guide or monitor treatment for MRSA infections. Performed at [facility], [address] [place], [place] [address]   Culture, blood (Routine x 2)     Status: None (Preliminary result)   Collection Time: 08/27/2018 10:30 AM  Result Value Ref Range Status   Specimen Description BLOOD RT FOREARM  Final   Special Requests   Final    BOTTLES DRAWN AEROBIC AND ANAEROBIC Blood Culture adequate volume   Culture   Final    NO GROWTH < 24 HOURS Performed at [facility], [address] [place], [place] [address]    Report Status PENDING  Incomplete  Culture, blood (Routine x 2)     Status: None (Preliminary result)   Collection Time: 08/27/2018 10:49 AM  Result Value Ref Range Status   Specimen Description BLOOD LT [facility]  Final   Special Requests   Final    BOTTLES DRAWN AEROBIC AND ANAEROBIC Blood Culture adequate volume   Culture   Final    NO GROWTH < 24 HOURS Performed at [facility], [address] [place], [place] [address]    Report Status PENDING  Incomplete  Culture, respiratory (NON-Expectorated)     Status: None (Preliminary result)   Collection Time: 08/27/2018 12:28 PM  Result Value Ref Range Status   Specimen Description   Final    TRACHEAL  ASPIRATE Performed at [facility], [address] [place], [place] [address]    Special Requests   Final    NONE Performed at [facility], [address] [place], [place] [address]    Gram Stain   Final    ABUNDANT WBC PRESENT, PREDOMINANTLY PMN RARE SQUAMOUS EPITHELIAL CELLS PRESENT ABUNDANT GRAM POSITIVE COCCI IN CLUSTERS FEW GRAM NEGATIVE COCCI IN PAIRS    Culture   Final    ABUNDANT STAPHYLOCOCCUS AUREUS SUSCEPTIBILITIES TO FOLLOW Performed at [facility], [address], [place], [place] [address]     Report Status PENDING  Incomplete    Lab Basic Metabolic Panel: Recent Labs  Lab 12/31/2017 1020 12/22/2017 2153 12/22/17 1042 12/22/17 2048 10-Jan-2018 0031  NA 139 143 139  139 137 139  K 3.1* 2.7*  2.6* 3.7  3.9 3.5 3.5  CL 113* 121* 108  107 101 106  CO2 13* 13* 20*  18* 22 22  GLUCOSE 415* 369* 292*  296* 207* 114*  BUN 33* 35* 36*  35* 38* 36*  CREATININE 1.61* 1.88* 1.98*  2.16* 2.24* 2.00*  CALCIUM 8.0* 7.0* 5.7*  6.1* 5.9* 6.3*  MG  --  1.7 1.5* 1.9 1.9  PHOS  --  <1.0* 5.1* 5.1*  --    Liver Function Tests: Recent Labs  Lab 12/22/2017 1020 12/22/17 1042  AST 26  --   ALT 13*  --   ALKPHOS 76  --   BILITOT 1.2  --   PROT 6.0*  --   ALBUMIN 2.0* 1.2*   No results for input(s): LIPASE, AMYLASE in the last 168 hours. No results for input(s): AMMONIA in the last 168 hours. CBC: Recent Labs  Lab 12/14/2017 1020 12/22/17 1042  WBC 14.0* 12.6*  NEUTROABS 13.4*  --   HGB 10.6* 12.0  HCT 32.7* 36.4  MCV 90.0 87.1  PLT 275 193   Cardiac Enzymes: Recent Labs  Lab 12/22/17 1042 2018/01/10 0031  CKTOTAL 198  --   TROPONINI  --  0.20*   Sepsis Labs: Recent Labs  Lab 12/18/2017 1020 12/16/2017 1239 12/13/2017 2153 12/22/17 1042 12/22/17 2048  WBC 14.0*  --   --  12.6*  --   LATICACIDVEN 2.9* 2.7* 4.7*  --  8.7*    Procedures/Operations  Intubation Central venous catheter   Faris Coolman S. [facility] ANP-BC Pulmonary and Critical Care Medicine [facility] Pager [phone] or [phone]  NB: This document was prepared using Dragon voice recognition software and may include unintentional dictation errors.    10-Jan-2018, 3:45 AM

## 2018-01-04 DEATH — deceased

## 2018-12-30 IMAGING — DX DG ABDOMEN 1V
2 series · 2 of 2 positions shown · non-contrast
Comparison: Plain films of the abdomen dated 12/21/2017.

CLINICAL DATA: Lactic acid acidosis.

EXAM:
ABDOMEN - 1 VIEW

[abdomen kub (1 of 2)]
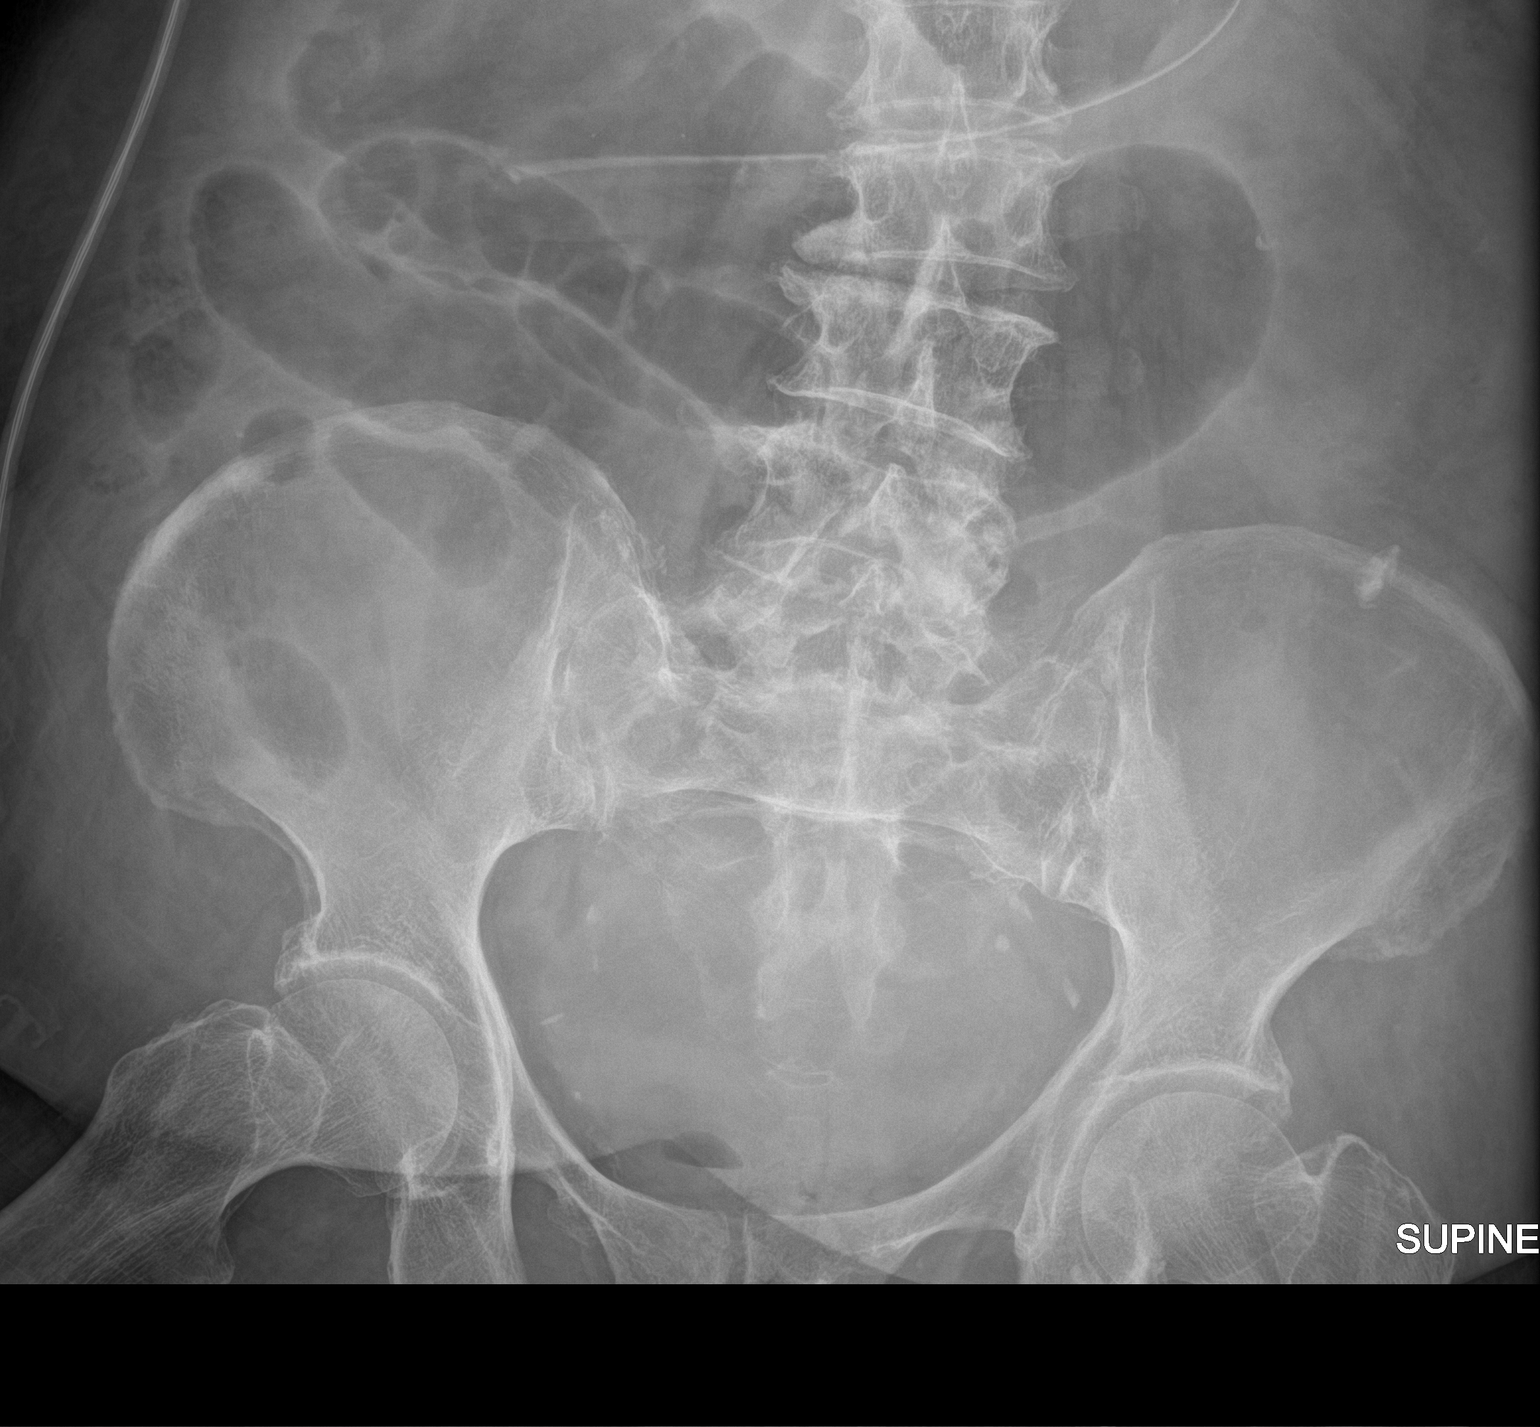

[abdomen kub (2 of 2)]
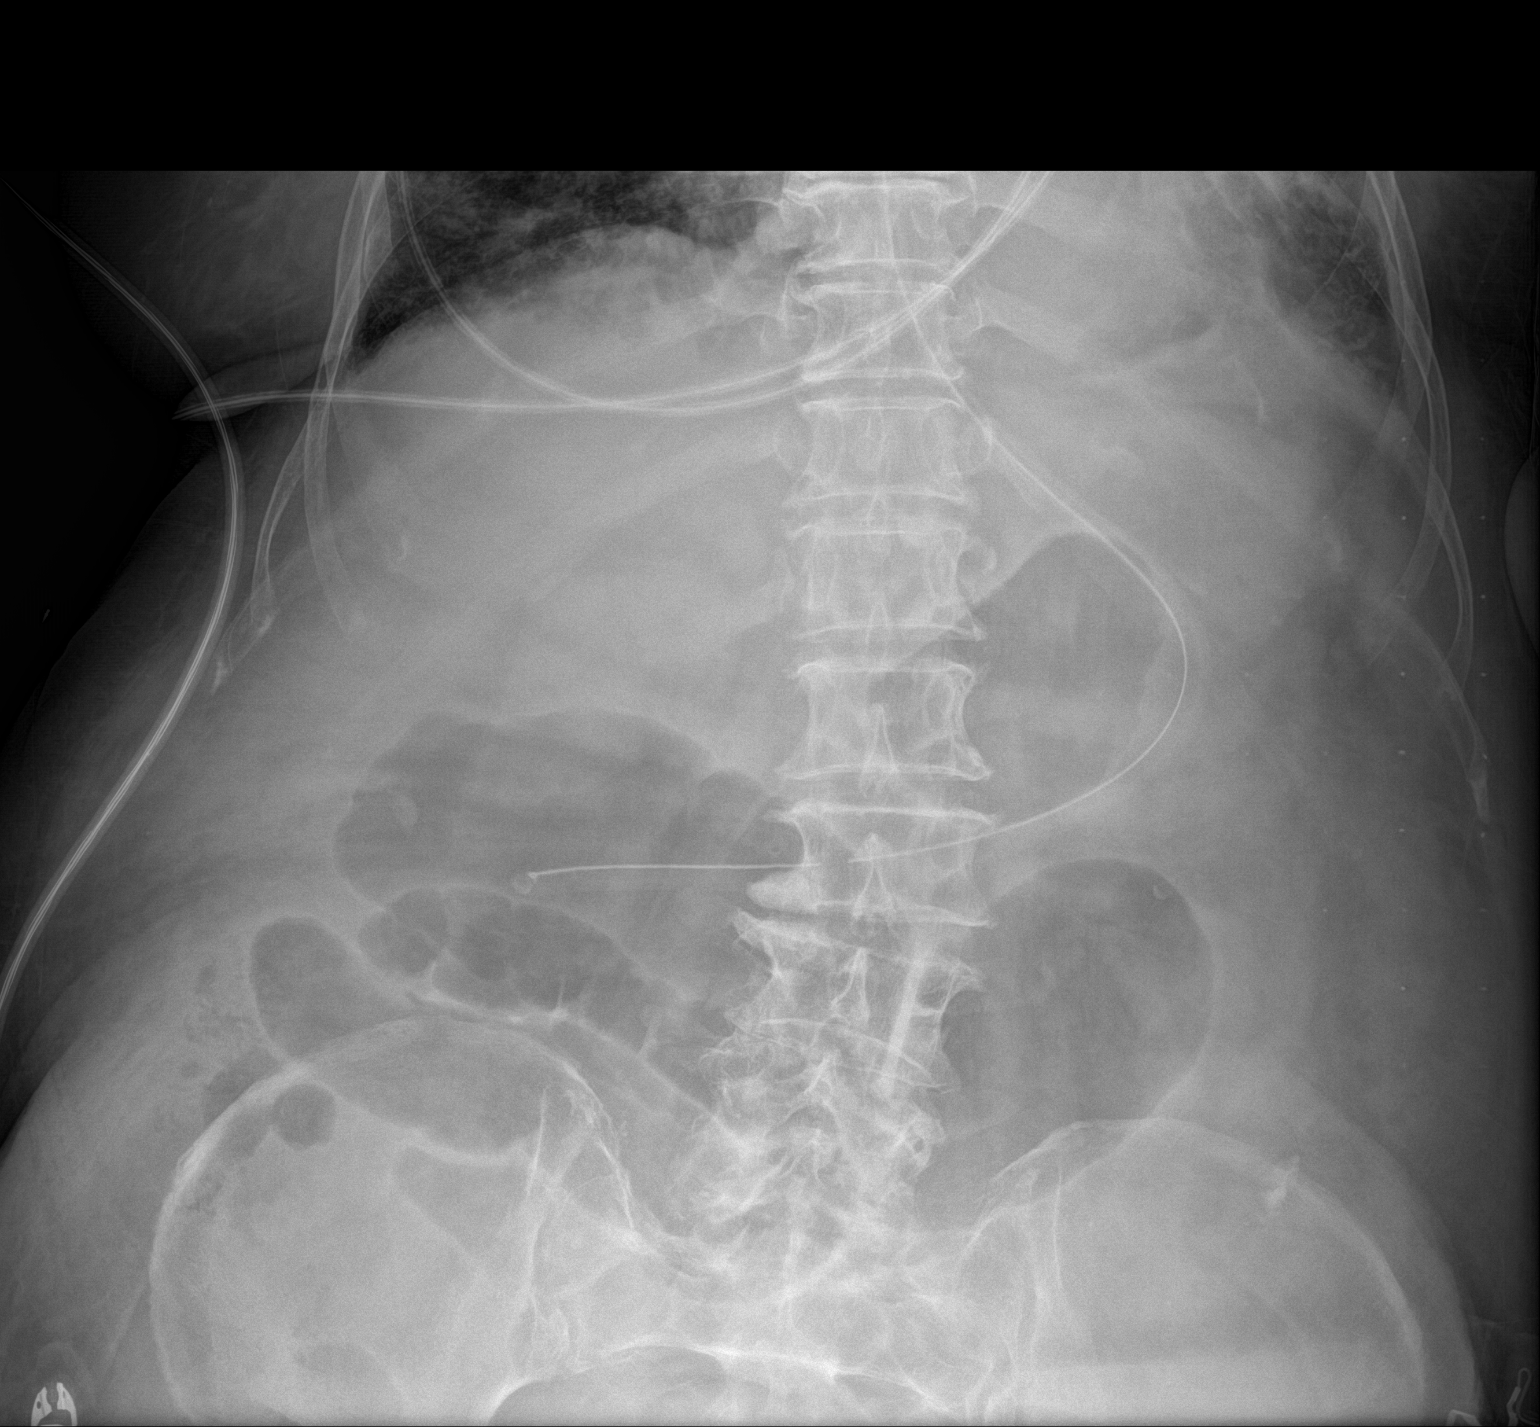

[2 of 2 positions shown; findings below may reference images not displayed]

FINDINGS: Enteric tube appears appropriately positioned in the stomach.
Prominent gas-filled loop of bowel is seen within the central
abdomen, uncertain if small or large bowel. No evidence of free
intraperitoneal air or pneumatosis intestinalis seen. No evidence of
soft tissue mass or abnormal fluid collection.
IMPRESSION: 1. Prominent patulous gas-filled loop of bowel within the central
abdomen, measuring approximately 7 cm diameter, uncertain if small
or large bowel, favor large bowel. A developing bowel obstruction
cannot be excluded. Recommend CT abdomen and pelvis for further
characterization.
2. Enteric tube appears appropriately positioned in the stomach.

These results will be called to the ordering clinician or
representative by the Radiologist Assistant, and communication
documented in the PACS or zVision Dashboard.
